# Patient Record
Sex: Male | Born: 1985 | Race: White | Hispanic: No | Marital: Single | Smoking: Current some day smoker
Health system: Southern US, Community
[De-identification: ages and names within clinical notes are randomized; demographics above are authoritative.]

## PROBLEM LIST (undated history)

## (undated) HISTORY — PX: INGUINAL HERNIA REPAIR: SUR1180

---

## 2012-12-16 ENCOUNTER — Emergency Department (HOSPITAL_COMMUNITY)
Admission: EM | Admit: 2012-12-16 | Discharge: 2012-12-16 | Disposition: A | Discharge status: Home / Self Care | Payer: Self-pay | Attending: Emergency Medicine | Admitting: Emergency Medicine

## 2012-12-16 ENCOUNTER — Encounter (HOSPITAL_COMMUNITY): Payer: Self-pay | Admitting: *Deleted

## 2012-12-16 DIAGNOSIS — F17200 Nicotine dependence, unspecified, uncomplicated: Secondary | ICD-10-CM | POA: Insufficient documentation

## 2012-12-16 DIAGNOSIS — R109 Unspecified abdominal pain: Secondary | ICD-10-CM | POA: Insufficient documentation

## 2012-12-16 DIAGNOSIS — K4090 Unilateral inguinal hernia, without obstruction or gangrene, not specified as recurrent: Secondary | ICD-10-CM | POA: Insufficient documentation

## 2012-12-16 DIAGNOSIS — R1900 Intra-abdominal and pelvic swelling, mass and lump, unspecified site: Secondary | ICD-10-CM | POA: Insufficient documentation

## 2012-12-16 NOTE — ED Provider Notes (Signed)
History     CSN: [id number]  Arrival date & time 12/16/12  1218   First MD Initiated Contact with Patient 12/16/12 1250      Chief Complaint  Patient presents with  . Abdominal Pain  . Hernia    (Consider location/radiation/quality/duration/timing/severity/associated sxs/prior treatment) HPI Complains of left groin pain and swelling at left groin intermittently for the past 2 weeks. Pain is worse with Valsalva type maneuver or lifting improved with remaining still . No other associated symptom no change in bowel habits bowel movements normal no vomiting no fever no other associated symptoms no treatment prior to coming here History reviewed. No pertinent past medical history. Past medical history negative History reviewed. No pertinent past surgical history.  History reviewed. No pertinent family history.  History  Substance Use Topics  . Smoking status: Current Every Day Smoker    Types: Cigarettes  . Smokeless tobacco: Not on file  . Alcohol Use: Yes     Comment: daily   positive marijuana use    Review of Systems  Constitutional: Negative.   HENT: Negative.   Respiratory: Negative.   Cardiovascular: Negative.   Gastrointestinal: Positive for abdominal pain.       Left groin pain  Musculoskeletal: Negative.   Skin: Negative.   Neurological: Negative.   Hematological: Negative.   Psychiatric/Behavioral: Negative.     Allergies  Silicone  Home Medications  No current outpatient prescriptions on file.  BP 143/92  Pulse 83  Temp 98.2 F (36.8 C) (Oral)  Resp 18  SpO2 100%  Physical Exam  Nursing note and vitals reviewed. Constitutional: He appears well-developed and well-nourished.  HENT:  Head: Normocephalic and atraumatic.  Eyes: Conjunctivae normal are normal. Pupils are equal, round, and reactive to light.  Neck: Neck supple. No tracheal deviation present. No thyromegaly present.  Cardiovascular: Normal rate and regular rhythm.   No murmur  heard. Pulmonary/Chest: Effort normal and breath sounds normal.  Abdominal: Soft. Bowel sounds are normal. He exhibits no distension. There is no tenderness.       There is a left inguinal hernia present when patient performs Valsalva maneuver  during standing. hernia reduces spontaneously. It is not red or warm and without signs of incarceration  Genitourinary:       Normal male genitalia, uncircumcised  Musculoskeletal: Normal range of motion. He exhibits no edema and no tenderness.  Neurological: He is alert. Coordination normal.  Skin: Skin is warm and dry. No rash noted.  Psychiatric: He has a normal mood and affect.    ED Course  Procedures (including critical care time)  Labs Reviewed - No data to display No results found.   No diagnosis found.    MDM  Plan referral central  surgery Diagnosis left inguinal hernia        Doug Sou, MD 12/16/12 1314

## 2012-12-16 NOTE — ED Notes (Signed)
Pt reports having left groin/abd pain. Thinks he has hernia. Denies any n/v/d.

## 2013-01-19 ENCOUNTER — Encounter (INDEPENDENT_AMBULATORY_CARE_PROVIDER_SITE_OTHER): Payer: Self-pay | Admitting: General Surgery

## 2013-01-19 ENCOUNTER — Ambulatory Visit (INDEPENDENT_AMBULATORY_CARE_PROVIDER_SITE_OTHER): Payer: PRIVATE HEALTH INSURANCE | Admitting: General Surgery

## 2013-01-19 VITALS — BP 126/64 | HR 68 | Temp 97.4°F | Resp 16 | Ht 70.0 in | Wt 157.2 lb

## 2013-01-19 DIAGNOSIS — K4090 Unilateral inguinal hernia, without obstruction or gangrene, not specified as recurrent: Secondary | ICD-10-CM | POA: Insufficient documentation

## 2013-01-19 NOTE — H&P (Signed)
Edward Gonzales is an 27 y.o. male.   Chief Complaint: the patient comes in for evaluation of a symptomatic left inguinal hernia. HPI: the patient is noted the hernia for some time but recently had a diagnosed on an emergency room visit because of continuing and worsening pain. He is otherwise healthy.  History reviewed. No pertinent past medical history.  History reviewed. No pertinent past surgical history.  Family History  Problem Relation Age of Onset  . Cancer Paternal Aunt     lymphoma   Social History:  reports that he has been smoking Cigarettes.  He has been smoking about 0.00 packs per day. He has never used smokeless tobacco. He reports that  drinks alcohol. He reports that he uses illicit drugs.  Allergies:  Allergies  Allergen Reactions  . Silicone Rash     (Not in a hospital admission)  No results found for this or any previous visit (from the past 48 hour(s)). No results found.  Review of Systems  Gastrointestinal: Negative.   All other systems reviewed and are negative.    Blood pressure 126/64, pulse 68, temperature 97.4 F (36.3 C), temperature source Temporal, resp. rate 16, height 5\' 10"  (1.778 m), weight 157 lb 3.2 oz (71.305 kg). Physical Exam  Constitutional: He is oriented to person, place, and time. He appears well-developed and well-nourished.  HENT:  Head: Normocephalic and atraumatic.  Eyes: Conjunctivae and EOM are normal. Pupils are equal, round, and reactive to light.  Neck: Normal range of motion. Neck supple.  Cardiovascular: Normal rate and regular rhythm.   Respiratory: Effort normal and breath sounds normal.  GI: Soft. Normal appearance and bowel sounds are normal. A hernia is present. Hernia confirmed positive in the left inguinal area.  Genitourinary: Penis normal.  Musculoskeletal: Normal range of motion.  Neurological: He is alert and oriented to person, place, and time. He has normal reflexes.  Skin: Skin is warm and dry.   Psychiatric: He has a normal mood and affect. His behavior is normal. Judgment and thought content normal.     Assessment/Plan Likely direct possibly small indirect left inguinal hernia.  Laparoscopic inguinal hernia repair with mesh. The risks and benefits have been explained to the patient and he wishes to proceed as soon as possible.  Cherylynn Ridges 01/19/2013, 4:07 PM

## 2013-01-19 NOTE — Progress Notes (Signed)
This patient's office visit has been documented as a admitting history and physical.

## 2016-02-21 ENCOUNTER — Emergency Department (HOSPITAL_COMMUNITY)
Admission: EM | Admit: 2016-02-21 | Discharge: 2016-02-21 | Disposition: A | Discharge status: Home / Self Care | Payer: Self-pay | Attending: Emergency Medicine | Admitting: Emergency Medicine

## 2016-02-21 ENCOUNTER — Encounter (HOSPITAL_COMMUNITY): Payer: Self-pay | Admitting: *Deleted

## 2016-02-21 DIAGNOSIS — F17210 Nicotine dependence, cigarettes, uncomplicated: Secondary | ICD-10-CM | POA: Insufficient documentation

## 2016-02-21 DIAGNOSIS — K4090 Unilateral inguinal hernia, without obstruction or gangrene, not specified as recurrent: Secondary | ICD-10-CM | POA: Insufficient documentation

## 2016-02-21 MED ORDER — HYDROMORPHONE HCL 1 MG/ML IJ SOLN
1.0000 mg | Freq: Once | INTRAMUSCULAR | Status: AC
  Administered 2016-02-21: 1 mg via INTRAVENOUS
  Filled 2016-02-21: qty 1

## 2016-02-21 MED ORDER — ONDANSETRON HCL 4 MG/2ML IJ SOLN
4.0000 mg | Freq: Once | INTRAMUSCULAR | Status: AC
  Administered 2016-02-21: 4 mg via INTRAVENOUS
  Filled 2016-02-21: qty 2

## 2016-02-21 NOTE — ED Provider Notes (Signed)
CSN: [id number]     Arrival date & time 02/21/16  1851 History   First MD Initiated Contact with Patient 02/21/16 2101     Chief Complaint  Patient presents with  . Hernia    Patient is a 30 y.o. male presenting with groin pain. The history is provided by the patient.  Groin Pain This is a recurrent problem. The current episode started 12 to 24 hours ago. The problem occurs constantly. The problem has been gradually worsening. Associated symptoms include abdominal pain. The symptoms are aggravated by walking. The symptoms are relieved by rest.  pt with h/o left inguinal hernia He has had this for several yrs but rarely causes pain Over past several hours he has had worsening pain/swelling in his left groin He reports nausea but no vomiting No new injury and no heavy lifting reported today   PMH -none Soc hx - smoker Family History  Problem Relation Age of Onset  . Cancer Paternal Aunt     lymphoma   Social History  Substance Use Topics  . Smoking status: Current Every Day Smoker    Types: Cigarettes  . Smokeless tobacco: Never Used  . Alcohol Use: Yes     Comment: daily    Review of Systems  Constitutional: Negative for fever.  Respiratory: Negative for cough.   Gastrointestinal: Positive for nausea and abdominal pain.  Genitourinary: Negative for dysuria.  Musculoskeletal: Negative for back pain.  All other systems reviewed and are negative.     Allergies  Silicone  Home Medications   Prior to Admission medications   Not on File   BP 160/97 mmHg  Pulse 97  Temp(Src) 97.5 F (36.4 C) (Oral)  Resp 18  SpO2 98% Physical Exam CONSTITUTIONAL: Well developed/well nourished HEAD: Normocephalic/atraumatic EYES: EOMI ENMT: Mucous membranes moist NECK: supple no meningeal signs CV: S1/S2 noted, no murmurs/rubs/gallops noted LUNGS: Lungs are clear to auscultation bilaterally, no apparent distress ABDOMEN: soft, nontender, no rebound or guarding, bowel sounds  noted throughout abdomen GU: left inguinal hernia noted without overlying erythema/discoloration.  No testicular tenderness.  No scrotal edema/erythema NEURO: Pt is awake/alert/appropriate, moves all extremitiesx4.  No facial droop.   EXTREMITIES: pulses normal/equal, full ROM SKIN: warm, color normal PSYCH: no abnormalities of mood noted, alert and oriented to situation  ED Course  Procedures  Medications  HYDROmorphone (DILAUDID) injection 1 mg (1 mg Intravenous Given 02/21/16 2229)  ondansetron (ZOFRAN) injection 4 mg (4 mg Intravenous Given 02/21/16 2226)  10:50 PM On initial exam, pt had significant tenderness and could not reduce hernia After IV dilaudid, pt more comfortable, no distress noted and his hernia had reduced spontaneously No other abdominal tenderness He appears well Will d/c home Discussed strict ER return precautions Gen. Surgery referral given   MDM   Final diagnoses:  Left inguinal hernia    Nursing notes including past medical history and social history reviewed and considered in documentation     Zadie Rhineonald Ressie Slevin, MD 02/21/16 2251

## 2016-02-21 NOTE — Discharge Instructions (Signed)

## 2016-02-21 NOTE — ED Notes (Signed)
Pt reports having hernia left groin, no problems in past but pain started to become severe today. Pain is radiating down into his testicles. No acute distress noted at triage.

## 2016-05-17 ENCOUNTER — Encounter (HOSPITAL_COMMUNITY): Payer: Self-pay

## 2016-05-17 ENCOUNTER — Emergency Department (HOSPITAL_COMMUNITY): Payer: Self-pay

## 2016-05-17 ENCOUNTER — Encounter (HOSPITAL_COMMUNITY): Payer: Self-pay | Admitting: Emergency Medicine

## 2016-05-17 ENCOUNTER — Emergency Department (HOSPITAL_COMMUNITY)
Admission: EM | Admit: 2016-05-17 | Discharge: 2016-05-17 | Disposition: A | Discharge status: Home / Self Care | Payer: Self-pay | Attending: Emergency Medicine | Admitting: Emergency Medicine

## 2016-05-17 ENCOUNTER — Ambulatory Visit (HOSPITAL_COMMUNITY)
Admission: EM | Admit: 2016-05-17 | Discharge: 2016-05-17 | Disposition: A | Discharge status: Nursing Home (Includes ICF) | Payer: Self-pay | Attending: Family Medicine | Admitting: Family Medicine

## 2016-05-17 DIAGNOSIS — R1032 Left lower quadrant pain: Secondary | ICD-10-CM | POA: Insufficient documentation

## 2016-05-17 DIAGNOSIS — F17210 Nicotine dependence, cigarettes, uncomplicated: Secondary | ICD-10-CM | POA: Insufficient documentation

## 2016-05-17 DIAGNOSIS — K4030 Unilateral inguinal hernia, with obstruction, without gangrene, not specified as recurrent: Secondary | ICD-10-CM

## 2016-05-17 LAB — URINALYSIS, ROUTINE W REFLEX MICROSCOPIC
Bilirubin Urine: NEGATIVE
Glucose, UA: NEGATIVE mg/dL
Hgb urine dipstick: NEGATIVE
Ketones, ur: >80 mg/dL — AB
LEUKOCYTES UA: NEGATIVE
NITRITE: NEGATIVE
PROTEIN: NEGATIVE mg/dL
SPECIFIC GRAVITY, URINE: 1.029 (ref 1.005–1.030)
pH: 7.5 (ref 5.0–8.0)

## 2016-05-17 LAB — CBC WITH DIFFERENTIAL/PLATELET
Basophils Absolute: 0 10*3/uL (ref 0.0–0.1)
Basophils Relative: 0 %
EOS PCT: 1 %
Eosinophils Absolute: 0.1 10*3/uL (ref 0.0–0.7)
HCT: 40.9 % (ref 39.0–52.0)
Hemoglobin: 14.2 g/dL (ref 13.0–17.0)
LYMPHS ABS: 2.5 10*3/uL (ref 0.7–4.0)
LYMPHS PCT: 36 %
MCH: 31.1 pg (ref 26.0–34.0)
MCHC: 34.7 g/dL (ref 30.0–36.0)
MCV: 89.5 fL (ref 78.0–100.0)
MONO ABS: 0.8 10*3/uL (ref 0.1–1.0)
Monocytes Relative: 12 %
Neutro Abs: 3.5 10*3/uL (ref 1.7–7.7)
Neutrophils Relative %: 51 %
PLATELETS: 166 10*3/uL (ref 150–400)
RBC: 4.57 MIL/uL (ref 4.22–5.81)
RDW: 12.1 % (ref 11.5–15.5)
WBC: 6.9 10*3/uL (ref 4.0–10.5)

## 2016-05-17 LAB — BASIC METABOLIC PANEL
ANION GAP: 8 (ref 5–15)
BUN: 7 mg/dL (ref 6–20)
CHLORIDE: 107 mmol/L (ref 101–111)
CO2: 23 mmol/L (ref 22–32)
Calcium: 8.7 mg/dL — ABNORMAL LOW (ref 8.9–10.3)
Creatinine, Ser: 0.85 mg/dL (ref 0.61–1.24)
GFR calc Af Amer: >60 mL/min (ref >60)
GFR calc non Af Amer: >60 mL/min (ref >60)
Glucose, Bld: 89 mg/dL (ref 65–99)
POTASSIUM: 3.7 mmol/L (ref 3.5–5.1)
SODIUM: 138 mmol/L (ref 135–145)

## 2016-05-17 MED ORDER — IOPAMIDOL (ISOVUE-300) INJECTION 61%
INTRAVENOUS | Status: AC
  Administered 2016-05-17: 100 mL
  Filled 2016-05-17: qty 100

## 2016-05-17 MED ORDER — MORPHINE SULFATE (PF) 4 MG/ML IV SOLN
4.0000 mg | Freq: Once | INTRAVENOUS | Status: AC
  Administered 2016-05-17: 4 mg via INTRAVENOUS
  Filled 2016-05-17: qty 1

## 2016-05-17 MED ORDER — HYDROCODONE-ACETAMINOPHEN 5-325 MG PO TABS: 1.0000 | ORAL_TABLET | Freq: Four times a day (QID) | ORAL | Status: DC | PRN

## 2016-05-17 MED ORDER — ONDANSETRON HCL 4 MG/2ML IJ SOLN
4.0000 mg | Freq: Once | INTRAMUSCULAR | Status: AC
  Administered 2016-05-17: 4 mg via INTRAVENOUS
  Filled 2016-05-17: qty 2

## 2016-05-17 NOTE — ED Notes (Signed)
Complain of groin pain, hernia for 3 years that has worsened recently.

## 2016-05-17 NOTE — ED Notes (Signed)
Pt. Was sent to us from Urgent care for an incarcerated Lt. inguinal hernia.  Pt. Is  Lt. Testicle pain 8/10

## 2016-05-17 NOTE — ED Provider Notes (Signed)
CSN: [id number]     Arrival date & time 05/17/16  1306 History   First MD Initiated Contact with Patient 05/17/16 1348     Chief Complaint  Patient presents with  . Hernia   (Consider location/radiation/quality/duration/timing/severity/associated sxs/prior Treatment) Patient is a 30 y.o. male presenting with abdominal pain. The history is provided by the patient.  Abdominal Pain Pain location:  LLQ Pain quality: sharp   Pain radiates to:  Does not radiate Pain severity:  Moderate Onset quality:  Gradual Progression:  Worsening Chronicity:  Chronic Context comment:  Present for 2-548yrs, worse in past 1wk. Relieved by:  Nothing Worsened by:  Nothing tried Ineffective treatments:  None tried Associated symptoms: nausea   Associated symptoms: no fever and no vomiting     History reviewed. No pertinent past medical history. History reviewed. No pertinent past surgical history. Family History  Problem Relation Age of Onset  . Cancer Paternal Aunt     lymphoma   Social History  Substance Use Topics  . Smoking status: Current Every Day Smoker    Types: Cigarettes  . Smokeless tobacco: Never Used  . Alcohol Use: Yes     Comment: daily    Review of Systems  Constitutional: Negative.  Negative for fever.  Gastrointestinal: Positive for nausea and abdominal pain. Negative for vomiting.  Genitourinary: Positive for testicular pain. Negative for flank pain and discharge.  Musculoskeletal: Negative for back pain.  All other systems reviewed and are negative.   Allergies  Silicone  Home Medications   Prior to Admission medications   Not on File   Meds Ordered and Administered this Visit  Medications - No data to display  BP 131/84 mmHg  Pulse 67  Temp(Src) 98.7 F (37.1 C) (Oral)  Resp 16  SpO2 99% No data found.   Physical Exam  Constitutional: He is oriented to person, place, and time. He appears well-developed and well-nourished.  Abdominal: Soft. Bowel sounds  are normal. He exhibits no mass. There is tenderness. There is no rebound and no guarding. A hernia is present. Hernia confirmed positive in the left inguinal area. Hernia confirmed negative in the right inguinal area.    Neurological: He is alert and oriented to person, place, and time.  Skin: Skin is warm and dry.  Nursing note and vitals reviewed.   ED Course  Procedures (including critical care time)  Labs Review Labs Reviewed - No data to display  Imaging Review No results found.   Visual Acuity Review  Right Eye Distance:   Left Eye Distance:   Bilateral Distance:    Right Eye Near:   Left Eye Near:    Bilateral Near:         MDM   1. Incarcerated left inguinal hernia    Sent for surg eval of incar left inguinal hernia.    Linna HoffJames D Nikkita Adeyemi, MD 05/17/16 [phone]

## 2016-05-17 NOTE — ED Provider Notes (Signed)
CSN: [id number]     Arrival date & time 05/17/16  1502 History   First MD Initiated Contact with Patient 05/17/16 1652     Chief Complaint  Patient presents with  . Inguinal Hernia     (Consider location/radiation/quality/duration/timing/severity/associated sxs/prior Treatment) HPI Comments: Patient presents to the emergency department with chief complaint of left lower quadrant pain. He states that he has a known hernia which she has had for the past 2-3 years. States that it has recently significantly worsened over the past week or so. He was seen at urgent care today, and sent to the emergency department for further evaluation. He denies any vomiting, but states that he has had some associated nausea. He states that the pain comes and goes, and radiates to his testicle. Currently rates pain as an 8 out of 10 and is worsened with palpation. He has not taken anything for her symptoms. 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  The history is provided by the patient. No language interpreter was used.    History reviewed. No pertinent past medical history. History reviewed. No pertinent past surgical history. Family History  Problem Relation Age of Onset  . Cancer Paternal Aunt     lymphoma   Social History  Substance Use Topics  . Smoking status: Current Every Day Smoker    Types: Cigarettes  . Smokeless tobacco: Never Used  . Alcohol Use: Yes     Comment: daily    Review of Systems  Constitutional: Negative for fever and chills.  Respiratory: Negative for shortness of breath.   Cardiovascular: Negative for chest pain.  Gastrointestinal: Positive for nausea and abdominal pain. Negative for vomiting, diarrhea and constipation.  Genitourinary: Negative for dysuria.  All other systems reviewed and are negative.     Allergies  Silicone  Home Medications   Prior to Admission medications   Not on File   BP 118/75 mmHg  Pulse 61   Temp(Src) 97.5 F (36.4 C) (Oral)  Resp 14  Ht  (1.753 m)  Wt 65.857 kg  BMI 21.43 kg/m2  SpO2 100% Physical Exam  Constitutional: He is oriented to person, place, and time. He appears well-developed and well-nourished.  HENT:  Head: Normocephalic and atraumatic.  Eyes: Conjunctivae and EOM are normal. Pupils are equal, round, and reactive to light. Right eye exhibits no discharge. Left eye exhibits no discharge. No scleral icterus.  Neck: Normal range of motion. Neck supple. No JVD present.  Cardiovascular: Normal rate, regular rhythm and normal heart sounds.  Exam reveals no gallop and no friction rub.   No murmur heard. Pulmonary/Chest: Effort normal and breath sounds normal. No respiratory distress. He has no wheezes. He has no rales. He exhibits no tenderness.  Abdominal: Soft. He exhibits no distension and no mass. There is no tenderness. There is no rebound and no guarding.  Genitourinary:  No tenderness to palpation of testicles, normal cremasteric reflex bilaterally, no swelling, moderate tenderness to palpation to the left inguinal canal no erythema, no abscess  Musculoskeletal: Normal range of motion. He exhibits no edema or tenderness.  Neurological: He is alert and oriented to person, place, and time.  Skin: Skin is warm and dry.  Psychiatric: He has a normal mood and affect. His behavior is normal. Judgment and thought content normal.  Nursing note and vitals reviewed.   ED Course  Procedures (including critical care time) Results for orders placed or performed during the  hospital encounter of 05/17/16  CBC with Differential/Platelet  Result Value Ref Range   WBC 6.9 4.0 - 10.5 K/uL   RBC 4.57 4.22 - 5.81 MIL/uL   Hemoglobin 14.2 13.0 - 17.0 g/dL   HCT 04.540.9 40.939.0 - 81.152.0 %   MCV 89.5 78.0 - 100.0 fL   MCH 31.1 26.0 - 34.0 pg   MCHC 34.7 30.0 - 36.0 g/dL   RDW 91.412.1 78.211.5 - 95.615.5 %   Platelets 166 150 - 400 K/uL   Neutrophils Relative % 51 %   Neutro Abs 3.5 1.7  - 7.7 K/uL   Lymphocytes Relative 36 %   Lymphs Abs 2.5 0.7 - 4.0 K/uL   Monocytes Relative 12 %   Monocytes Absolute 0.8 0.1 - 1.0 K/uL   Eosinophils Relative 1 %   Eosinophils Absolute 0.1 0.0 - 0.7 K/uL   Basophils Relative 0 %   Basophils Absolute 0.0 0.0 - 0.1 K/uL  Basic metabolic panel  Result Value Ref Range   Sodium 138 135 - 145 mmol/L   Potassium 3.7 3.5 - 5.1 mmol/L   Chloride 107 101 - 111 mmol/L   CO2 23 22 - 32 mmol/L   Glucose, Bld 89 65 - 99 mg/dL   BUN 7 6 - 20 mg/dL   Creatinine, Ser 2.130.85 0.61 - 1.24 mg/dL   Calcium 8.7 (L) 8.9 - 10.3 mg/dL   GFR calc non Af Amer >60 >60 mL/min   GFR calc Af Amer >60 >60 mL/min   Anion gap 8 5 - 15  Urinalysis, Routine w reflex microscopic (not [facility])  Result Value Ref Range   Color, Urine YELLOW YELLOW   APPearance CLEAR CLEAR   Specific Gravity, Urine 1.029 1.005 - 1.030   pH 7.5 5.0 - 8.0   Glucose, UA NEGATIVE NEGATIVE mg/dL   Hgb urine dipstick NEGATIVE NEGATIVE   Bilirubin Urine NEGATIVE NEGATIVE   Ketones, ur >80 (A) NEGATIVE mg/dL   Protein, ur NEGATIVE NEGATIVE mg/dL   Nitrite NEGATIVE NEGATIVE   Leukocytes, UA NEGATIVE NEGATIVE   Ct Abdomen Pelvis W Contrast  05/17/2016  CLINICAL DATA:  Left-sided abdominal pain radiating into the groin for 2 days EXAM: CT ABDOMEN AND PELVIS WITH CONTRAST TECHNIQUE: Multidetector CT imaging of the abdomen and pelvis was performed using the standard protocol following bolus administration of intravenous contrast. CONTRAST:  100mL ISOVUE-300 IOPAMIDOL (ISOVUE-300) INJECTION 61% COMPARISON:  None. FINDINGS: Lung bases are free of acute infiltrate or sizable effusion. The liver, gallbladder, spleen, adrenal glands and pancreas are within normal limits. The kidneys are well visualized bilaterally without renal calculi or obstructive change. The appendix is well visualized within normal limits. No inflammatory changes are seen. The bladder is well distended. No pelvic mass lesion or  sidewall abnormality is noted. No inguinal hernias are seen. No acute bony abnormality is noted. IMPRESSION: No acute abnormality noted. Electronically Signed   By: Alcide CleverMark  Lukens M.D.   On: 05/17/2016 18:27    I have personally reviewed and evaluated these images and lab results as part of my medical decision-making.   MDM   Final diagnoses:  Left inguinal pain    Patient sent to ED by urgent care for evaluation of possible left inguinal hernia. Patient states pain radiates from his left inguinal canal to his left testicle. He denies pain with palpation of the left testicle, and has a normal GU exam, no discharge, no lesions, normal cremasteric reflex. Symptoms have been intermittent for 2-3 years, and worse in  the past week. Doubt torsion. Will check CT scan of abdomen.  CT scan is unremarkable for any inguinal hernia. Unclear etiology of patient's pain. Urinalysis is unremarkable for infection. Will recommend follow-up with primary care provider. Return precautions given. Patient is stable and ready for discharge.    Roxy Horseman, PA-C 05/17/16 1959  Mancel Bale, MD 05/18/16 [phone]

## 2016-05-17 NOTE — ED Notes (Addendum)
Pt verbalized understanding of d/c instructions and has no further questions. Pt stable and NAD. Pt d/c home with friend driving.  

## 2016-05-17 NOTE — Discharge Instructions (Signed)
Abdominal Pain, Adult °Many things can cause abdominal pain. Usually, abdominal pain is not caused by a disease and will improve without treatment. It can often be observed and treated at home. Your health care provider will do a physical exam and possibly order blood tests and X-rays to help determine the seriousness of your pain. However, in many cases, more time must pass before a clear cause of the pain can be found. Before that point, your health care provider may not know if you need more testing or further treatment. °HOME CARE INSTRUCTIONS °Monitor your abdominal pain for any changes. The following actions may help to alleviate any discomfort you are experiencing: °· Only take over-the-counter or prescription medicines as directed by your health care provider. °· Do not take laxatives unless directed to do so by your health care provider. °· Try a clear liquid diet (broth, tea, or water) as directed by your health care provider. Slowly move to a bland diet as tolerated. °SEEK MEDICAL CARE IF: °· You have unexplained abdominal pain. °· You have abdominal pain associated with nausea or diarrhea. °· You have pain when you urinate or have a bowel movement. °· You experience abdominal pain that wakes you in the night. °· You have abdominal pain that is worsened or improved by eating food. °· You have abdominal pain that is worsened with eating fatty foods. °· You have a fever. °SEEK IMMEDIATE MEDICAL CARE IF: °· Your pain does not go away within 2 hours. °· You keep throwing up (vomiting). °· Your pain is felt only in portions of the abdomen, such as the right side or the left lower portion of the abdomen. °· You pass bloody or black tarry stools. °MAKE SURE YOU: °· Understand these instructions. °· Will watch your condition. °· Will get help right away if you are not doing well or get worse. °  °This information is not intended to replace advice given to you by your health care provider. Make sure you discuss  any questions you have with your health care provider. °  °Document Released: 09/04/2005 Document Revised: 08/16/2015 Document Reviewed: 08/04/2013 °Elsevier Interactive Patient Education ©2016 [employer] ° °Flank Pain °Flank pain refers to pain that is located on the side of the body between the upper abdomen and the back. The pain may occur over a short period of time (acute) or may be long-term or reoccurring (chronic). It may be mild or severe. Flank pain can be caused by many things. °CAUSES  °Some of the more common causes of flank pain include: °· Muscle strains.   °· Muscle spasms.   °· A disease of your spine (vertebral disk disease).   °· A lung infection (pneumonia).   °· Fluid around your lungs (pulmonary edema).   °· A kidney infection.   °· Kidney stones.   °· A very painful skin rash caused by the chickenpox virus (shingles).   °· Gallbladder disease.   °HOME CARE INSTRUCTIONS  °Home care will depend on the cause of your pain. In general, °· Rest as directed by your caregiver. °· Drink enough fluids to keep your urine clear or pale yellow. °· Only take over-the-counter or prescription medicines as directed by your caregiver. Some medicines may help relieve the pain. °· Tell your caregiver about any changes in your pain. °· Follow up with your caregiver as directed. °SEEK IMMEDIATE MEDICAL CARE IF:  °· Your pain is not controlled with medicine.   °· You have new or worsening symptoms. °· Your pain increases.   °· You have abdominal   pain.   °· You have shortness of breath.   °· You have persistent nausea or vomiting.   °· You have swelling in your abdomen.   °· You feel faint or pass out.   °· You have blood in your urine. °· You have a fever or persistent symptoms for more than 2-3 days. °· You have a fever and your symptoms suddenly get worse. °MAKE SURE YOU:  °· Understand these instructions. °· Will watch your condition. °· Will get help right away if you are not doing well or get worse. °  °This  information is not intended to replace advice given to you by your health care provider. Make sure you discuss any questions you have with your health care provider. °  °Document Released: 01/16/2006 Document Revised: 08/19/2012 Document Reviewed: 07/09/2012 °Elsevier Interactive Patient Education ©2016 [employer] ° °

## 2016-06-21 ENCOUNTER — Ambulatory Visit (INDEPENDENT_AMBULATORY_CARE_PROVIDER_SITE_OTHER): Payer: Self-pay | Admitting: Family Medicine

## 2016-06-21 ENCOUNTER — Encounter: Payer: Self-pay | Admitting: Family Medicine

## 2016-06-21 VITALS — BP 116/71 | HR 68 | Temp 97.9°F | Resp 14 | Ht 69.0 in | Wt 149.0 lb

## 2016-06-21 DIAGNOSIS — K4090 Unilateral inguinal hernia, without obstruction or gangrene, not specified as recurrent: Secondary | ICD-10-CM

## 2016-06-21 DIAGNOSIS — Z23 Encounter for immunization: Secondary | ICD-10-CM

## 2016-06-24 NOTE — Patient Instructions (Signed)
Go for US to see if we can confirm diagnosis of hernia.

## 2016-06-24 NOTE — Progress Notes (Signed)
Patient ID: Edward Gonzales, male   DOB: 09/20/1986, 30 y.o.   MRN: [id number]   Edward Gonzales, is a 30 y.o. male  WUJ:[id number]  NFA:[id number]:[id number]  DOB - 06/10/1986  CC:  Chief Complaint  Patient presents with  . Hernia    needs referral for surgery        HPI: Edward Gonzales is a 30 y.o. male here to establish care. His only complaint is of a left inguinal. He was seen at urgent care and ED recently and diagnosed with hernia based on physical finding. A CT scan failed to find a hernia.. Patient denies any additional health problems. Is on no chronic medications. He is in need of tetanus booster. He does not remember being screened for HIV. We will address that the next time he needs labs.  Allergies  Allergen Reactions  . Silicone Rash   History reviewed. No pertinent past medical history. Current Outpatient Prescriptions on File Prior to Visit  Medication Sig Dispense Refill  . HYDROcodone-acetaminophen (NORCO/VICODIN) 5-325 MG tablet Take 1 tablet by mouth every 6 (six) hours as needed. (Patient not taking: Reported on 06/21/2016) 6 tablet 0   No current facility-administered medications on file prior to visit.   Family History  Problem Relation Age of Onset  . Cancer Paternal Aunt     lymphoma   Social History   Social History  . Marital Status: Single    Spouse Name: N/A  . Number of Children: N/A  . Years of Education: N/A   Occupational History  . Not on file.   Social History Main Topics  . Smoking status: Current Some Day Smoker    Types: Cigarettes  . Smokeless tobacco: Never Used  . Alcohol Use: Yes     Comment: daily occ.   . Drug Use: Yes    Special: Marijuana     Comment: daily  . Sexual Activity: Not on file   Other Topics Concern  . Not on file   Social History Narrative    Review of Systems: Constitutional: Negative for fever, chills, appetite change, weight loss,  Fatigue. Skin: Negative for rashes or lesions of concern. HENT: Negative  for ear pain, ear discharge.nose bleeds Eyes: Negative for pain, discharge, redness, itching and visual disturbance. Neck: Negative for pain, stiffness Respiratory: Negative for cough, shortness of breath,   Cardiovascular: Negative for chest pain, palpitations and leg swelling. Gastrointestinal: Negative for abdominal pain, nausea, vomiting, diarrhea, constipations. Positive for lump in left groin Genitourinary: Negative for dysuria, urgency, frequency, hematuria,  Musculoskeletal: Negative for back pain, joint pain, joint  swelling, and gait problem.Negative for weakness. Neurological: Negative for dizziness, tremors, seizures, syncope,   light-headedness, numbness and headaches.  Hematological: Negative for easy bruising or bleeding Psychiatric/Behavioral: Negative for depression, anxiety, decreased concentration, confusion   Objective:   Filed Vitals:   06/21/16 1109  BP: 116/71  Pulse: 68  Temp: 97.9 F (36.6 C)  Resp: 14    Physical Exam: Constitutional: Patient appears well-developed and well-nourished. No distress. HENT: Normocephalic, atraumatic, External right and left ear normal. Oropharynx is clear and moist.  Eyes: Conjunctivae and EOM are normal. PERRLA, no scleral icterus. Neck: Normal ROM. Neck supple. No lymphadenopathy, No thyromegaly. CVS: RRR, S1/S2 +, no murmurs, no gallops, no rubs Pulmonary: Effort and breath sounds normal, no stridor, rhonchi, wheezes, rales.  Abdominal: Soft. Normoactive BS,, no distension, tenderness, rebound or guarding. There is a firm lump in the left groin approx 3x3. The lump is firm. I am  unable to reduce due to his discomfort. Musculoskeletal: Normal range of motion. No edema and no tenderness.  Neuro: Alert.Normal muscle tone coordination. Non-focal Skin: Skin is warm and dry. No rash noted. Not diaphoretic. No erythema. No pallor. Psychiatric: Normal mood and affect. Behavior, judgment, thought content normal.  Lab Results   Component Value Date   WBC 6.9 05/17/2016   HGB 14.2 05/17/2016   HCT 40.9 05/17/2016   MCV 89.5 05/17/2016   PLT 166 05/17/2016   Lab Results  Component Value Date   CREATININE 0.85 05/17/2016   BUN 7 05/17/2016   NA 138 05/17/2016   K 3.7 05/17/2016   CL 107 05/17/2016   CO2 23 05/17/2016    No results found for: HGBA1C Lipid Panel  No results found for: CHOL, TRIG, HDL, CHOLHDL, VLDL, LDLCALC     Assessment and plan:   1. Unilateral inguinal hernia without obstruction or gangrene, recurrence not specified  - US Abdomen Limited; Future   No Follow-up on file.  The patient was given clear instructions to go to ER or return to medical center if symptoms don't improve, worsen or new problems develop. The patient verbalized understanding.    Henrietta Hoover FNP  06/24/2016, 12:50 PM

## 2017-05-25 IMAGING — CT CT ABD-PELV W/ CM
2 of 4 series · 16 of 46 positions shown, 18 images · IV contrast (APPLIED)
Comparison: None.

CLINICAL DATA: Left-sided abdominal pain radiating into the groin
for 2 days

EXAM:
CT ABDOMEN AND PELVIS WITH CONTRAST
TECHNIQUE: Multidetector CT imaging of the abdomen and pelvis was performed
using the standard protocol following bolus administration of
intravenous contrast.
CONTRAST:  100mL TBUYIB-8RR IOPAMIDOL (TBUYIB-8RR) INJECTION 61%

[Series 2: abd/ pelvis 5.0 i30f 1 · axial · 0.62mm/px · z∈[-834,-419]mm · 13 of 91 slices shown, 15 images]
[im 4/91  soft-tissue]
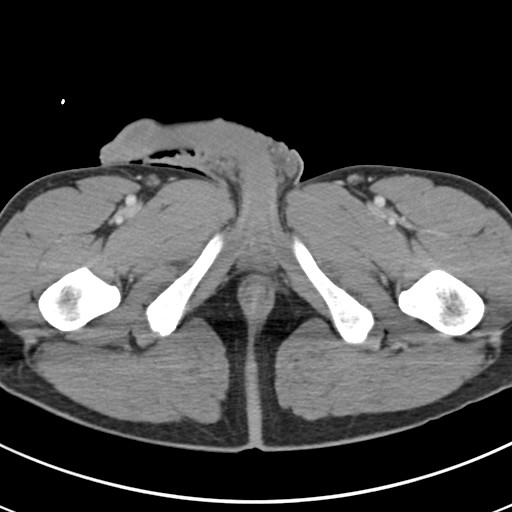
[im 4/91  bone]
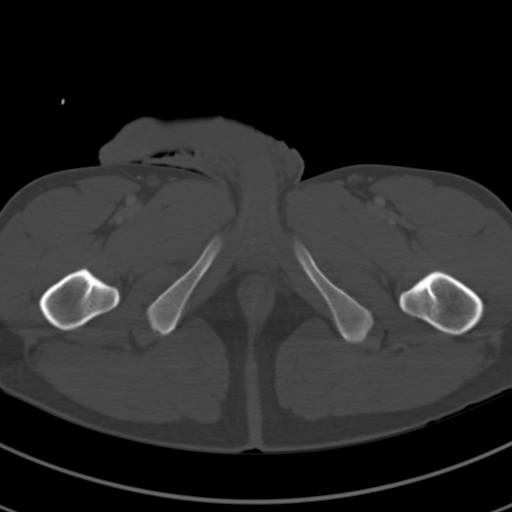
[im 12/91  soft-tissue]
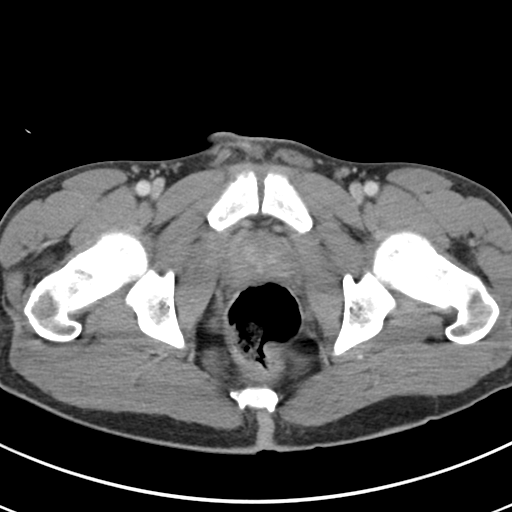
[im 19/91  soft-tissue]
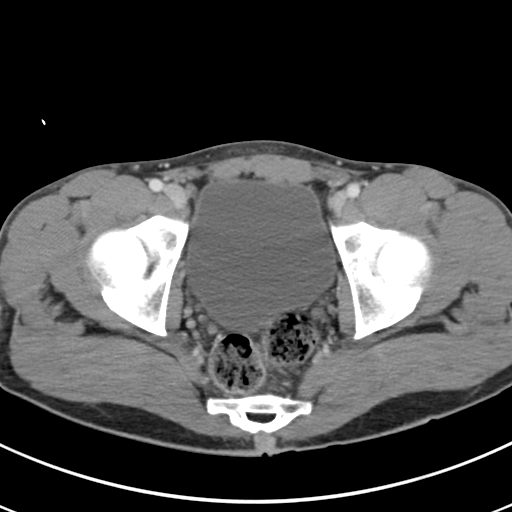
[im 27/91  soft-tissue]
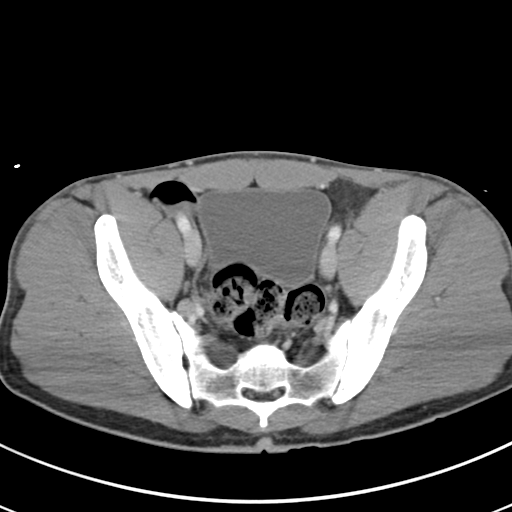
[im 31/91  soft-tissue]
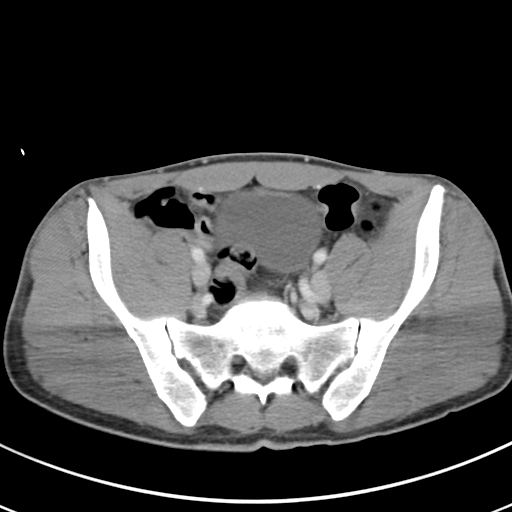
[im 38/91  soft-tissue]
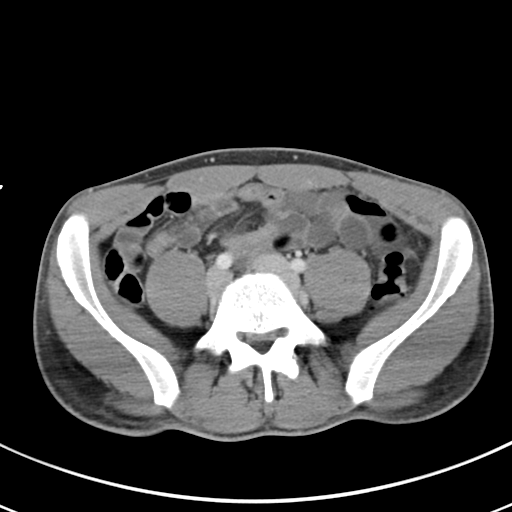
[im 46/91  soft-tissue]
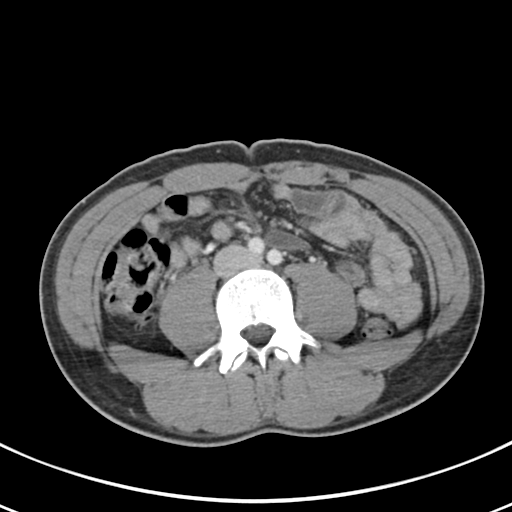
[im 53/91  soft-tissue]
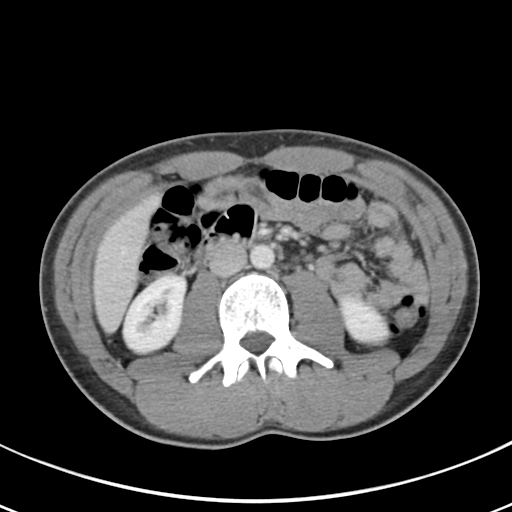
[im 61/91  soft-tissue]
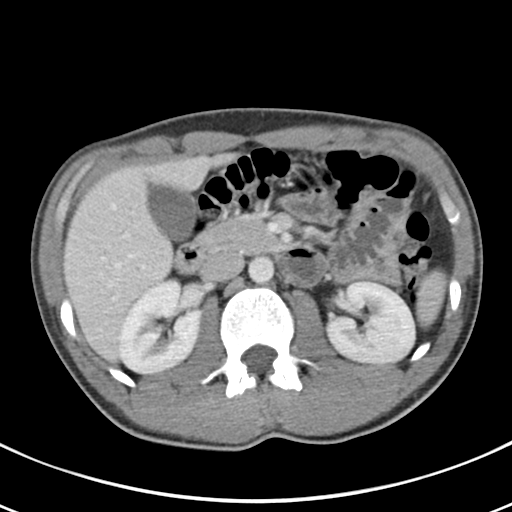
[im 61/91  bone]
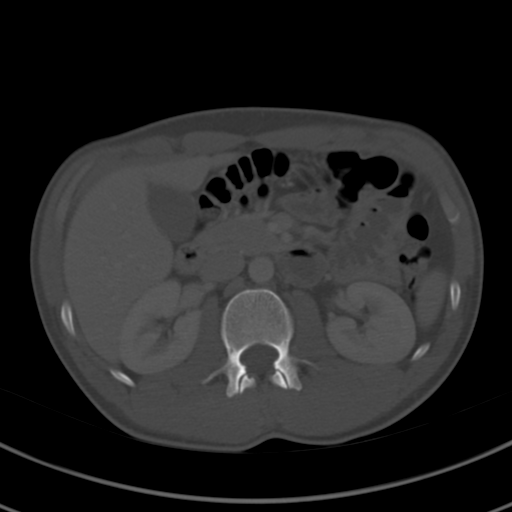
[im 64/91  soft-tissue]
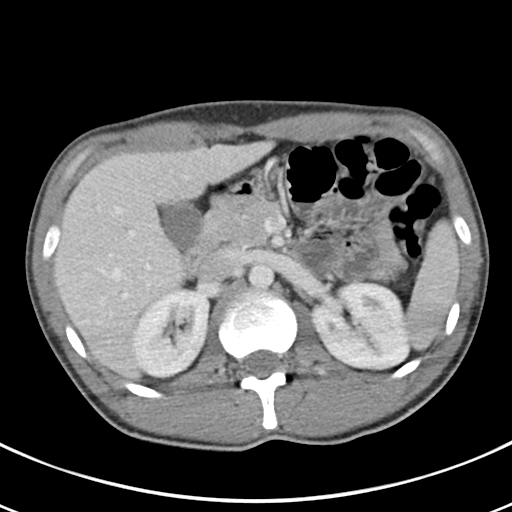
[im 72/91  soft-tissue]
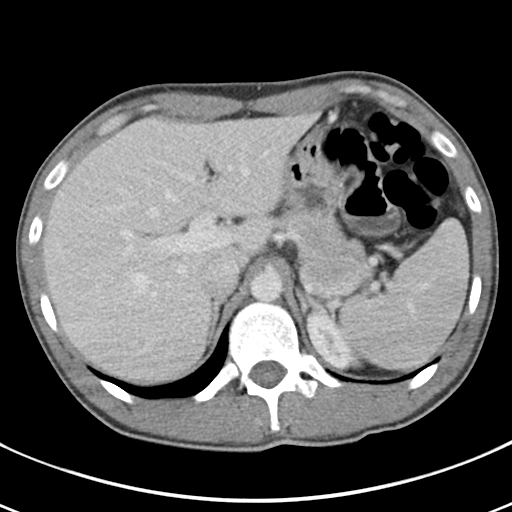
[im 79/91  soft-tissue]
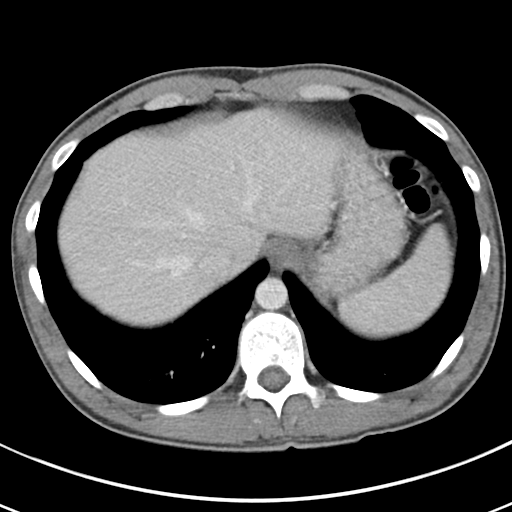
[im 87/91  soft-tissue]
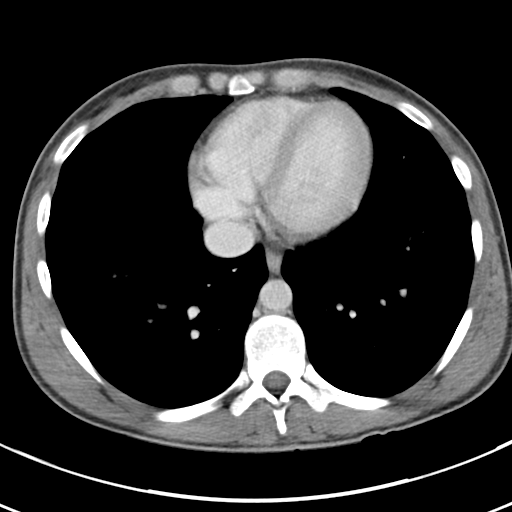

[Series 6: coronal soft tissue · coronal · 0.68mm/px · 3 of 87 slices shown]
[im 29/87  soft-tissue]
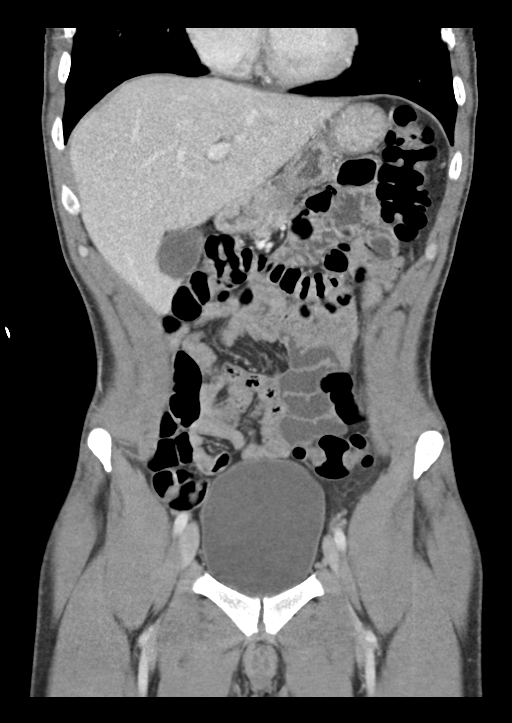
[im 39/87  soft-tissue]
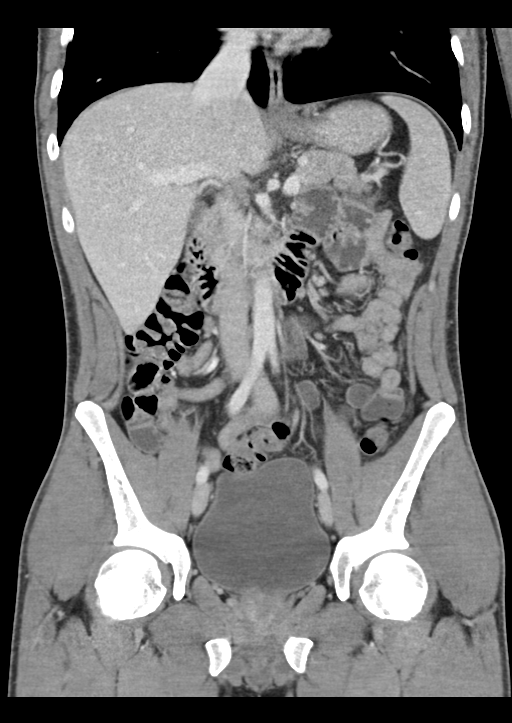
[im 48/87  soft-tissue]
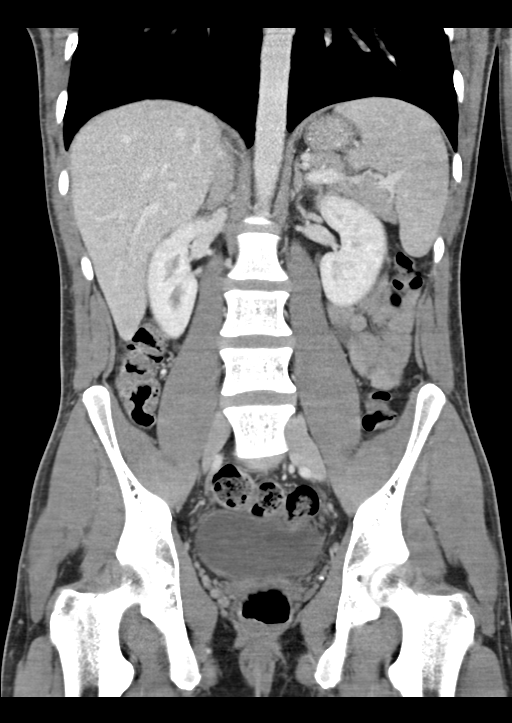

[16 of 46 positions shown; findings below may reference images not displayed]

FINDINGS: Lung bases are free of acute infiltrate or sizable effusion.

The liver, gallbladder, spleen, adrenal glands and pancreas are
within normal limits. The kidneys are well visualized bilaterally
without renal calculi or obstructive change.

The appendix is well visualized within normal limits. No
inflammatory changes are seen. The bladder is well distended. No
pelvic mass lesion or sidewall abnormality is noted. No inguinal
hernias are seen. No acute bony abnormality is noted.
IMPRESSION: No acute abnormality noted.

## 2022-03-19 ENCOUNTER — Encounter (HOSPITAL_COMMUNITY): Payer: Self-pay

## 2022-03-19 ENCOUNTER — Other Ambulatory Visit: Payer: Self-pay

## 2022-03-19 ENCOUNTER — Observation Stay (HOSPITAL_COMMUNITY)
Admission: EM | Admit: 2022-03-19 | Discharge: 2022-03-20 | Disposition: A | Discharge status: Home / Self Care | Payer: No Typology Code available for payment source | Attending: Otolaryngology | Admitting: Otolaryngology

## 2022-03-19 ENCOUNTER — Emergency Department (HOSPITAL_COMMUNITY): Payer: No Typology Code available for payment source

## 2022-03-19 DIAGNOSIS — S02652A Fracture of angle of left mandible, initial encounter for closed fracture: Secondary | ICD-10-CM

## 2022-03-19 DIAGNOSIS — S02609A Fracture of mandible, unspecified, initial encounter for closed fracture: Principal | ICD-10-CM | POA: Diagnosis present

## 2022-03-19 DIAGNOSIS — W11XXXA Fall on and from ladder, initial encounter: Secondary | ICD-10-CM

## 2022-03-19 LAB — BASIC METABOLIC PANEL
Anion gap: 9 (ref 5–15)
BUN: 9 mg/dL (ref 6–20)
CO2: 26 mmol/L (ref 22–32)
Calcium: 8.8 mg/dL — ABNORMAL LOW (ref 8.9–10.3)
Chloride: 103 mmol/L (ref 98–111)
Creatinine, Ser: 0.78 mg/dL (ref 0.61–1.24)
GFR, Estimated: >60 mL/min (ref >60)
Glucose, Bld: 93 mg/dL (ref 70–99)
Potassium: 3.8 mmol/L (ref 3.5–5.1)
Sodium: 138 mmol/L (ref 135–145)

## 2022-03-19 LAB — CBC WITH DIFFERENTIAL/PLATELET
Abs Immature Granulocytes: 0.01 10*3/uL (ref 0.00–0.07)
Basophils Absolute: 0.1 10*3/uL (ref 0.0–0.1)
Basophils Relative: 1 %
Eosinophils Absolute: 0.1 10*3/uL (ref 0.0–0.5)
Eosinophils Relative: 2 %
HCT: 41.5 % (ref 39.0–52.0)
Hemoglobin: 14.3 g/dL (ref 13.0–17.0)
Immature Granulocytes: 0 %
Lymphocytes Relative: 38 %
Lymphs Abs: 2.5 10*3/uL (ref 0.7–4.0)
MCH: 32.9 pg (ref 26.0–34.0)
MCHC: 34.5 g/dL (ref 30.0–36.0)
MCV: 95.4 fL (ref 80.0–100.0)
Monocytes Absolute: 0.8 10*3/uL (ref 0.1–1.0)
Monocytes Relative: 12 %
Neutro Abs: 3.2 10*3/uL (ref 1.7–7.7)
Neutrophils Relative %: 47 %
Platelets: 193 10*3/uL (ref 150–400)
RBC: 4.35 MIL/uL (ref 4.22–5.81)
RDW: 11.4 % — ABNORMAL LOW (ref 11.5–15.5)
WBC: 6.7 10*3/uL (ref 4.0–10.5)
nRBC: 0 % (ref 0.0–0.2)

## 2022-03-19 MED ORDER — MORPHINE SULFATE (PF) 4 MG/ML IV SOLN
4.0000 mg | Freq: Once | INTRAVENOUS | Status: AC
  Administered 2022-03-19: 4 mg via INTRAVENOUS
  Filled 2022-03-19: qty 1

## 2022-03-19 MED ORDER — DEXTROSE-NACL 5-0.45 % IV SOLN: INTRAVENOUS | Status: DC

## 2022-03-19 MED ORDER — HYDROCODONE-ACETAMINOPHEN 5-325 MG PO TABS
1.0000 | ORAL_TABLET | ORAL | Status: DC | PRN
  Administered 2022-03-20: 1 via ORAL
  Filled 2022-03-19: qty 1

## 2022-03-19 MED ORDER — SODIUM CHLORIDE 0.9 % IV SOLN
3.0000 g | Freq: Four times a day (QID) | INTRAVENOUS | Status: DC
  Administered 2022-03-19 – 2022-03-20 (×3): 3 g via INTRAVENOUS
  Filled 2022-03-19 (×4): qty 8

## 2022-03-19 MED ORDER — ONDANSETRON HCL 4 MG/2ML IJ SOLN: 4.0000 mg | Freq: Four times a day (QID) | INTRAMUSCULAR | Status: DC | PRN

## 2022-03-19 MED ORDER — ONDANSETRON 4 MG PO TBDP: 4.0000 mg | ORAL_TABLET | Freq: Four times a day (QID) | ORAL | Status: DC | PRN

## 2022-03-19 NOTE — Consult Note (Signed)
Reason for Consult:mandible fracture ?Referring Physician: Dr Renaye Rakers ? ?Edward Gonzales is an 36 y.o. male.  ?HPI: With a history of a fall and hit his chin on a ladder rung.  This happened on Friday.  He initially did not think he had any injury just had some pain and thought he heard a tooth.  He is pain has increased.  He does have malocclusion which manifest as a shift in his occlusion to the left.  He has now presented and had a CT scan which shows a fracture of both angles left extends up more into the condyle and is slightly displaced. ? ?History reviewed. No pertinent past medical history. ? ?History reviewed. No pertinent surgical history. ? ?Family History  ?Problem Relation Age of Onset  ? Cancer Paternal Aunt   ?     lymphoma  ? ? ?Social History:  reports that he has been smoking cigarettes. He has never used smokeless tobacco. He reports current alcohol use. He reports current drug use. Drug: Marijuana. ? ?Allergies:  ?Allergies  ?Allergen Reactions  ? Silicone Rash  ? ? ?Medications: I have reviewed the patient's current medications. ? ?Results for orders placed or performed during the hospital encounter of 03/19/22 (from the past 48 hour(s))  ?CBC with Differential     Status: Abnormal  ? Collection Time: 03/19/22  7:24 PM  ?Result Value Ref Range  ? WBC 6.7 4.0 - 10.5 K/uL  ? RBC 4.35 4.22 - 5.81 MIL/uL  ? Hemoglobin 14.3 13.0 - 17.0 g/dL  ? HCT 41.5 39.0 - 52.0 %  ? MCV 95.4 80.0 - 100.0 fL  ? MCH 32.9 26.0 - 34.0 pg  ? MCHC 34.5 30.0 - 36.0 g/dL  ? RDW 11.4 (L) 11.5 - 15.5 %  ? Platelets 193 150 - 400 K/uL  ? nRBC 0.0 0.0 - 0.2 %  ? Neutrophils Relative % 47 %  ? Neutro Abs 3.2 1.7 - 7.7 K/uL  ? Lymphocytes Relative 38 %  ? Lymphs Abs 2.5 0.7 - 4.0 K/uL  ? Monocytes Relative 12 %  ? Monocytes Absolute 0.8 0.1 - 1.0 K/uL  ? Eosinophils Relative 2 %  ? Eosinophils Absolute 0.1 0.0 - 0.5 K/uL  ? Basophils Relative 1 %  ? Basophils Absolute 0.1 0.0 - 0.1 K/uL  ? Immature Granulocytes 0 %  ? Abs  Immature Granulocytes 0.01 0.00 - 0.07 K/uL  ?  Comment: Performed at [facility], [address], [place], [place] [address]  ?Basic metabolic panel     Status: Abnormal  ? Collection Time: 03/19/22  7:24 PM  ?Result Value Ref Range  ? Sodium 138 135 - 145 mmol/L  ? Potassium 3.8 3.5 - 5.1 mmol/L  ? Chloride 103 98 - 111 mmol/L  ? CO2 26 22 - 32 mmol/L  ? Glucose, Bld 93 70 - 99 mg/dL  ?  Comment: Glucose reference range applies only to samples taken after fasting for at least 8 hours.  ? BUN 9 6 - 20 mg/dL  ? Creatinine, Ser 0.78 0.61 - 1.24 mg/dL  ? Calcium 8.8 (L) 8.9 - 10.3 mg/dL  ? GFR, Estimated >60 >60 mL/min  ?  Comment: (NOTE) ?Calculated using the CKD-EPI Creatinine Equation (2021) ?  ? Anion gap 9 5 - 15  ?  Comment: Performed at [facility], [address] [place], [place] [address]  ? ? ?CT Maxillofacial Wo Contrast ? ?Result Date: 03/19/2022 ?CLINICAL DATA:  Facial trauma, blunt  EXAM: CT MAXILLOFACIAL WITHOUT CONTRAST TECHNIQUE: Multidetector CT imaging of the maxillofacial structures was performed. Multiplanar CT image reconstructions were also generated. RADIATION DOSE REDUCTION: This exam was performed according to the departmental dose-optimization program which includes automated exposure control, adjustment of the mA and/or kV according to patient size and/or use of iterative reconstruction technique. COMPARISON:  None. FINDINGS: Osseous: Acute displaced and mildly comminuted fracture of the left mandibular ramus near the mandibular foramen extending to the angle. Additional mildly displaced fracture of the left condylar head. Third nondisplaced fracture of the right mandibular ramus near the mandibular foramen. Orbits: No intraorbital hematoma. Sinuses: Mild lobular right maxillary sinus mucosal thickening. Soft tissues: Soft tissue swelling at the chin. Limited intracranial: No acute abnormality. IMPRESSION: Acute fractures of the mandible as detailed above. Electronically Signed    By: Guadlupe Spanish M.D.   On: 03/19/2022 18:54   ? ?ROS ?Blood pressure (!) 145/89, pulse 67, temperature 99 ?F (37.2 ?C), temperature source Oral, resp. rate 19, height 5\' 9"  (1.753 m), weight 76.7 kg, SpO2 100 %. ?Physical Exam ?HENT:  ?   Head: Normocephalic and atraumatic.  ?   Right Ear: Tympanic membrane normal.  ?   Left Ear: Tympanic membrane normal.  ?   Nose: Nose normal.  ?   Mouth/Throat:  ?   Comments: His teeth are without significant disrepair.  It looks like his occlusion is shifted to the left.  His left side hits first before the right side.  There is no ecchymosis or significant swelling.  The tongue is normal and floor mouth is normal. ?Eyes:  ?   Extraocular Movements: Extraocular movements intact.  ?   Pupils: Pupils are equal, round, and reactive to light.  ?Musculoskeletal:  ?   Cervical back: Normal range of motion.  ?Neurological:  ?   Mental Status: He is alert.  ? ? ? ? ?Assessment/Plan: ?Mandible fracture-patient both angles and more extended up superiorly on the left are fractured.  The bone is not significantly displaced to justify the need for open reduction.  We talked about options of open reduction, closed reduction with MMF, and soft diet.  Since he has malocclusion, we will proceed with the MMF.  He will need to be in wires for about 4 to 5 weeks.  He last ate at approximately 2-3 o'clock today so he will be admitted and proceed tomorrow with the surgery. ? ? ?03/19/2022, 8:33 PM  ? ? ? ?

## 2022-03-19 NOTE — ED Triage Notes (Signed)
Pt c/o L sided facial swelling after hitting chin Friday morning, states swelling began "a few hours after." States he took ibuprofen & "an antibiotic" at home. States he bit down hard when he hit his chin. Associated dental pain in "back L corner, more lower." No regular dentist ? ?Ibuprofen "just before I got here" ?

## 2022-03-19 NOTE — ED Provider Notes (Signed)
?[facility] EMERGENCY DEPARTMENT ?Provider Note ? ? ?CSN: [id number] ?Arrival date & time: 03/19/22  1634 ? ?  ? ?History ? ?Chief Complaint  ?Patient presents with  ? Facial Pain  ? Dental Pain  ? ? ?Edward Gonzales is a 36 y.o. male who presents to the ED today with complaint of gradual onset, constant, achy, left jaw pain that occurred Friday.  Patient states that he was working on his ladder approximately 3 feet off the ground when he fell. On his way down he hit his chin on a rung of the ladder. He states that he bit down hard at that time.  He states that since then he has had swelling and pain to the left jaw.  He is having difficulty opening his jaw all the way.  He denies any specific dental pain to me despite triage note.  He has not taking anything specifically for pain.  ? ?The history is provided by the patient and medical records.  ? ?  ? ?Home Medications ?Prior to Admission medications   ?Not on File  ?   ? ?Allergies    ?Silicone   ? ?Review of Systems   ?Review of Systems  ?Constitutional:  Negative for chills and fever.  ?HENT:  Negative for dental problem.   ?     + jaw pain  ?All other systems reviewed and are negative. ? ?Physical Exam ?Updated Vital Signs ?BP (!) 145/89 (BP Location: Right Arm)   Pulse 67   Temp 99 ?F (37.2 ?C) (Oral)   Resp 19   Ht 5\' 9"  (1.753 m)   Wt 76.7 kg   SpO2 100%   BMI 24.96 kg/m?  ?Physical Exam ?Vitals and nursing note reviewed.  ?Constitutional:   ?   Appearance: He is not ill-appearing.  ?HENT:  ?   Head: Normocephalic.  ?   Comments: Mild swelling noted to L mandible with associated TTP. 2 finger trismus. Dentition intact without loosening.  ?Eyes:  ?   Conjunctiva/sclera: Conjunctivae normal.  ?Cardiovascular:  ?   Rate and Rhythm: Normal rate and regular rhythm.  ?Pulmonary:  ?   Effort: Pulmonary effort is normal.  ?   Breath sounds: Normal breath sounds.  ?Skin: ?   General: Skin is warm and dry.  ?   Coloration: Skin is not  jaundiced.  ?Neurological:  ?   Mental Status: He is alert.  ? ? ?ED Results / Procedures / Treatments   ?Labs ?(all labs ordered are listed, but only abnormal results are displayed) ?Labs Reviewed  ?CBC WITH DIFFERENTIAL/PLATELET - Abnormal; Notable for the following components:  ?    Result Value  ? RDW 11.4 (*)   ? All other components within normal limits  ?BASIC METABOLIC PANEL - Abnormal; Notable for the following components:  ? Calcium 8.8 (*)   ? All other components within normal limits  ? ? ?EKG ?None ? ?Radiology ?CT Maxillofacial Wo Contrast ? ?Result Date: 03/19/2022 ?CLINICAL DATA:  Facial trauma, blunt EXAM: CT MAXILLOFACIAL WITHOUT CONTRAST TECHNIQUE: Multidetector CT imaging of the maxillofacial structures was performed. Multiplanar CT image reconstructions were also generated. RADIATION DOSE REDUCTION: This exam was performed according to the departmental dose-optimization program which includes automated exposure control, adjustment of the mA and/or kV according to patient size and/or use of iterative reconstruction technique. COMPARISON:  None. FINDINGS: Osseous: Acute displaced and mildly comminuted fracture of the left mandibular ramus near the mandibular foramen extending to the angle. Additional mildly  displaced fracture of the left condylar head. Third nondisplaced fracture of the right mandibular ramus near the mandibular foramen. Orbits: No intraorbital hematoma. Sinuses: Mild lobular right maxillary sinus mucosal thickening. Soft tissues: Soft tissue swelling at the chin. Limited intracranial: No acute abnormality. IMPRESSION: Acute fractures of the mandible as detailed above. Electronically Signed   By: Guadlupe Spanish M.D.   On: 03/19/2022 18:54   ? ?Procedures ?Procedures  ? ? ?Medications Ordered in ED ?Medications  ?morphine (PF) 4 MG/ML injection 4 mg (4 mg Intravenous Given 03/19/22 1942)  ? ? ?ED Course/ Medical Decision Making/ A&P ?  ?                        ?Medical Decision  Making ?36 year old male who presents to the ED today with complaint of left sided jaw pain status post mechanical fall that occurred 4 days ago.  On arrival to the ED vitals are stable.  CT maxillofacial without contrast ordered in triage, as I am evaluating patient CT tech is here to take patient to CT.  On exam he is noted to have tenderness palpation along the left mandible without crepitus.  He does have 2 finger trismus.  Dentition does appear intact.  No obvious wounds, lacerations that require suturing.  We will plan for CT scan and further evaluation afterwards. ? ?Amount and/or Complexity of Data Reviewed ?Labs: ordered. ?Radiology: ordered. ?   Details: CT personally viewed by myself - does appaer to have mandibular fracture. Confirmed by radiologist: Acute displaced and mildly comminuted fracture of the left  ?mandibular ramus near the mandibular foramen extending to the angle.  ?Additional mildly displaced fracture of the left condylar head.  ?Third nondisplaced fracture of the right mandibular ramus near the  ?mandibular foramen. ?Discussion of management or test interpretation with external provider(s): Discussed case with ENT Dr. Jearld Fenton who will come evaluate patient at bedside.  ? ?Risk ?Prescription drug management. ?Decision regarding hospitalization. ? ? ?Dr. Jearld Fenton to take pt to the OR.  ? ? ? ? ? ? ? ?Final Clinical Impression(s) / ED Diagnoses ?Final diagnoses:  ?Closed fracture of left side of mandible, unspecified mandibular site, initial encounter (HCC)  ? ? ?Rx / DC Orders ?ED Discharge Orders   ? ? None  ? ?  ? ? ?  ?Tanda Rockers, PA-C ?03/19/22 2051 ? ?  ?Terald Sleeper, MD ?03/19/22 2124 ? ?

## 2022-03-20 ENCOUNTER — Observation Stay (HOSPITAL_BASED_OUTPATIENT_CLINIC_OR_DEPARTMENT_OTHER): Payer: No Typology Code available for payment source | Admitting: Anesthesiology

## 2022-03-20 ENCOUNTER — Encounter (HOSPITAL_COMMUNITY): Payer: Self-pay | Admitting: Otolaryngology

## 2022-03-20 ENCOUNTER — Telehealth (INDEPENDENT_AMBULATORY_CARE_PROVIDER_SITE_OTHER): Payer: Self-pay | Admitting: Otolaryngology

## 2022-03-20 ENCOUNTER — Other Ambulatory Visit: Payer: Self-pay

## 2022-03-20 ENCOUNTER — Observation Stay (HOSPITAL_COMMUNITY): Payer: No Typology Code available for payment source | Admitting: Anesthesiology

## 2022-03-20 ENCOUNTER — Encounter (HOSPITAL_COMMUNITY)
Admission: EM | Disposition: A | Discharge status: Home / Self Care | Payer: Self-pay | Source: Home / Self Care | Attending: Emergency Medicine

## 2022-03-20 DIAGNOSIS — S02652A Fracture of angle of left mandible, initial encounter for closed fracture: Secondary | ICD-10-CM

## 2022-03-20 DIAGNOSIS — M264 Malocclusion, unspecified: Secondary | ICD-10-CM

## 2022-03-20 DIAGNOSIS — S02651A Fracture of angle of right mandible, initial encounter for closed fracture: Secondary | ICD-10-CM

## 2022-03-20 DIAGNOSIS — W11XXXA Fall on and from ladder, initial encounter: Secondary | ICD-10-CM

## 2022-03-20 HISTORY — PX: ORIF MANDIBULAR FRACTURE: SHX2127

## 2022-03-20 LAB — HIV ANTIBODY (ROUTINE TESTING W REFLEX): HIV Screen 4th Generation wRfx: NONREACTIVE

## 2022-03-20 SURGERY — OPEN REDUCTION INTERNAL FIXATION (ORIF) MANDIBULAR FRACTURE
Anesthesia: General

## 2022-03-20 MED ORDER — ONDANSETRON HCL 4 MG/2ML IJ SOLN
INTRAMUSCULAR | Status: DC | PRN
  Administered 2022-03-20: 4 mg via INTRAVENOUS

## 2022-03-20 MED ORDER — PROPOFOL 10 MG/ML IV BOLUS
INTRAVENOUS | Status: AC
  Filled 2022-03-20: qty 20

## 2022-03-20 MED ORDER — HYDROCODONE-ACETAMINOPHEN 7.5-325 MG/15ML PO SOLN
15.0000 mL | Freq: Four times a day (QID) | ORAL | 0 refills | Status: DC | PRN
  Filled 2022-03-20: qty 120, 2d supply, fill #0

## 2022-03-20 MED ORDER — LIDOCAINE 2% (20 MG/ML) 5 ML SYRINGE
INTRAMUSCULAR | Status: DC | PRN
  Administered 2022-03-20: 80 mg via INTRAVENOUS

## 2022-03-20 MED ORDER — HYDROMORPHONE HCL 1 MG/ML IJ SOLN
0.2500 mg | INTRAMUSCULAR | Status: DC | PRN
  Administered 2022-03-20 (×4): 0.5 mg via INTRAVENOUS

## 2022-03-20 MED ORDER — DOUBLE ANTIBIOTIC 500-10000 UNIT/GM EX OINT
TOPICAL_OINTMENT | CUTANEOUS | Status: AC
  Filled 2022-03-20: qty 28.4

## 2022-03-20 MED ORDER — MIDAZOLAM HCL 2 MG/2ML IJ SOLN
INTRAMUSCULAR | Status: AC
  Filled 2022-03-20: qty 2

## 2022-03-20 MED ORDER — DEXMEDETOMIDINE (PRECEDEX) IN NS 20 MCG/5ML (4 MCG/ML) IV SYRINGE
PREFILLED_SYRINGE | INTRAVENOUS | Status: DC | PRN
  Administered 2022-03-20: 8 ug via INTRAVENOUS
  Administered 2022-03-20: 4 ug via INTRAVENOUS
  Administered 2022-03-20: 8 ug via INTRAVENOUS

## 2022-03-20 MED ORDER — OXYMETAZOLINE HCL 0.05 % NA SOLN
NASAL | Status: DC | PRN
  Administered 2022-03-20: 1

## 2022-03-20 MED ORDER — PROPOFOL 10 MG/ML IV BOLUS
INTRAVENOUS | Status: DC | PRN
  Administered 2022-03-20: 170 mg via INTRAVENOUS

## 2022-03-20 MED ORDER — DEXAMETHASONE SODIUM PHOSPHATE 10 MG/ML IJ SOLN
INTRAMUSCULAR | Status: AC
  Filled 2022-03-20: qty 2

## 2022-03-20 MED ORDER — 0.9 % SODIUM CHLORIDE (POUR BTL) OPTIME
TOPICAL | Status: DC | PRN
  Administered 2022-03-20: 1000 mL

## 2022-03-20 MED ORDER — OXYMETAZOLINE HCL 0.05 % NA SOLN
NASAL | Status: AC
  Filled 2022-03-20: qty 30

## 2022-03-20 MED ORDER — DEXAMETHASONE SODIUM PHOSPHATE 10 MG/ML IJ SOLN
INTRAMUSCULAR | Status: DC | PRN
  Administered 2022-03-20: 10 mg via INTRAVENOUS

## 2022-03-20 MED ORDER — HYDROCODONE-ACETAMINOPHEN 7.5-325 MG/15ML PO SOLN: 15.0000 mL | Freq: Four times a day (QID) | ORAL | 0 refills | Status: AC | PRN

## 2022-03-20 MED ORDER — LIDOCAINE-EPINEPHRINE 1 %-1:100000 IJ SOLN
INTRAMUSCULAR | Status: AC
  Filled 2022-03-20: qty 1

## 2022-03-20 MED ORDER — MIDAZOLAM HCL 2 MG/2ML IJ SOLN
INTRAMUSCULAR | Status: DC | PRN
  Administered 2022-03-20: 2 mg via INTRAVENOUS

## 2022-03-20 MED ORDER — HYDROMORPHONE HCL 1 MG/ML IJ SOLN
INTRAMUSCULAR | Status: AC
  Filled 2022-03-20: qty 1

## 2022-03-20 MED ORDER — ORAL CARE MOUTH RINSE: 15.0000 mL | Freq: Once | OROMUCOSAL | Status: AC

## 2022-03-20 MED ORDER — CHLORHEXIDINE GLUCONATE 0.12 % MT SOLN: 15.0000 mL | Freq: Once | OROMUCOSAL | Status: AC

## 2022-03-20 MED ORDER — ONDANSETRON HCL 4 MG/2ML IJ SOLN
INTRAMUSCULAR | Status: AC
  Filled 2022-03-20: qty 4

## 2022-03-20 MED ORDER — LIDOCAINE 2% (20 MG/ML) 5 ML SYRINGE
INTRAMUSCULAR | Status: AC
  Filled 2022-03-20: qty 10

## 2022-03-20 MED ORDER — CEPHALEXIN 250 MG/5ML PO SUSR
500.0000 mg | Freq: Three times a day (TID) | ORAL | 0 refills | Status: DC
Start: 2022-03-20 — End: 2022-03-20
  Filled 2022-03-20: qty 200, 4d supply, fill #0

## 2022-03-20 MED ORDER — LACTATED RINGERS IV SOLN: INTRAVENOUS | Status: DC

## 2022-03-20 MED ORDER — ROCURONIUM BROMIDE 10 MG/ML (PF) SYRINGE
PREFILLED_SYRINGE | INTRAVENOUS | Status: DC | PRN
  Administered 2022-03-20: 50 mg via INTRAVENOUS

## 2022-03-20 MED ORDER — CHLORHEXIDINE GLUCONATE 0.12 % MT SOLN
OROMUCOSAL | Status: AC
Start: 2022-03-20 — End: 2022-03-20
  Administered 2022-03-20: 15 mL via OROMUCOSAL
  Filled 2022-03-20: qty 15

## 2022-03-20 MED ORDER — FENTANYL CITRATE (PF) 250 MCG/5ML IJ SOLN
INTRAMUSCULAR | Status: AC
  Filled 2022-03-20: qty 5

## 2022-03-20 MED ORDER — ROCURONIUM BROMIDE 10 MG/ML (PF) SYRINGE
PREFILLED_SYRINGE | INTRAVENOUS | Status: AC
  Filled 2022-03-20: qty 10

## 2022-03-20 MED ORDER — CEPHALEXIN 250 MG/5ML PO SUSR: 500.0000 mg | Freq: Three times a day (TID) | ORAL | 0 refills | Status: AC

## 2022-03-20 MED ORDER — SUGAMMADEX SODIUM 200 MG/2ML IV SOLN
INTRAVENOUS | Status: DC | PRN
  Administered 2022-03-20 (×2): 200 mg via INTRAVENOUS

## 2022-03-20 MED ORDER — FENTANYL CITRATE (PF) 250 MCG/5ML IJ SOLN
INTRAMUSCULAR | Status: DC | PRN
  Administered 2022-03-20 (×2): 50 ug via INTRAVENOUS
  Administered 2022-03-20: 100 ug via INTRAVENOUS
  Administered 2022-03-20: 50 ug via INTRAVENOUS

## 2022-03-20 SURGICAL SUPPLY — 45 items
BAG COUNTER SPONGE SURGICOUNT (BAG) ×2 IMPLANT
BAR FIX PREFORMED OMNIMAX (Miscellaneous) ×2 IMPLANT
BLADE MANDIBULAR SCREWDRIVER (BLADE) ×1 IMPLANT
BLADE SURG 15 STRL LF DISP TIS (BLADE) IMPLANT
BLADE SURG 15 STRL SS (BLADE)
CANISTER SUCT 3000ML PPV (MISCELLANEOUS) ×2 IMPLANT
CLEANER TIP ELECTROSURG 2X2 (MISCELLANEOUS) ×2 IMPLANT
COVER BACK TABLE 60X90IN (DRAPES) ×1 IMPLANT
COVER SURGICAL LIGHT HANDLE (MISCELLANEOUS) ×4 IMPLANT
DECANTER SPIKE VIAL GLASS SM (MISCELLANEOUS) ×1 IMPLANT
DRAPE HALF SHEET 40X57 (DRAPES) IMPLANT
ELECT COATED BLADE 2.86 ST (ELECTRODE) IMPLANT
ELECT NDL BLADE 2-5/6 (NEEDLE) IMPLANT
ELECT NEEDLE BLADE 2-5/6 (NEEDLE) IMPLANT
ELECT REM PT RETURN 9FT ADLT (ELECTROSURGICAL) ×2
ELECTRODE REM PT RTRN 9FT ADLT (ELECTROSURGICAL) ×1 IMPLANT
GAUZE 4X4 16PLY ~~LOC~~+RFID DBL (SPONGE) ×1 IMPLANT
GLOVE ECLIPSE 7.5 STRL STRAW (GLOVE) ×2 IMPLANT
GOWN STRL REUS W/ TWL LRG LVL3 (GOWN DISPOSABLE) ×2 IMPLANT
GOWN STRL REUS W/TWL LRG LVL3 (GOWN DISPOSABLE) ×2
KIT BASIN OR (CUSTOM PROCEDURE TRAY) ×2 IMPLANT
KIT TURNOVER KIT B (KITS) ×2 IMPLANT
NDL HYPO 25GX1X1/2 BEV (NEEDLE) IMPLANT
NEEDLE HYPO 25GX1X1/2 BEV (NEEDLE) IMPLANT
NS IRRIG 1000ML POUR BTL (IV SOLUTION) ×2 IMPLANT
PAD ARMBOARD 7.5X6 YLW CONV (MISCELLANEOUS) ×4 IMPLANT
PATTIES SURGICAL .5 X3 (DISPOSABLE) IMPLANT
PENCIL FOOT CONTROL (ELECTRODE) ×2 IMPLANT
POSITIONER HEAD DONUT 9IN (MISCELLANEOUS) IMPLANT
PROTECTOR CORNEAL (OPHTHALMIC RELATED) IMPLANT
SCISSORS WIRE ANG 4 3/4 DISP (INSTRUMENTS) ×1 IMPLANT
SCREW BONE 2X7 CROSS DRIVE (Screw) ×4 IMPLANT
SCREW BONE MANDIB SD 2X9 (Screw) ×6 IMPLANT
SUT CHROMIC 3 0 PS 2 (SUTURE) ×1 IMPLANT
SUT CHROMIC 4 0 PS 2 18 (SUTURE) ×3 IMPLANT
SUT ETHILON 5 0 P 3 18 (SUTURE) ×1
SUT NYLON ETHILON 5-0 P-3 1X18 (SUTURE) ×1 IMPLANT
SUT SILK 2 0 PERMA HAND 18 BK (SUTURE) IMPLANT
SUT STEEL 0 (SUTURE)
SUT STEEL 0 18XMFL TIE 17 (SUTURE) IMPLANT
SUT STEEL 4 (SUTURE) ×1 IMPLANT
TOWEL GREEN STERILE FF (TOWEL DISPOSABLE) ×2 IMPLANT
TRAY ENT MC OR (CUSTOM PROCEDURE TRAY) ×2 IMPLANT
WATER STERILE IRR 1000ML POUR (IV SOLUTION) ×2 IMPLANT
WIRE 24 GAUGE OMINIMAX MMF (WIRE) ×1 IMPLANT

## 2022-03-20 NOTE — Anesthesia Preprocedure Evaluation (Addendum)
Anesthesia Evaluation  ?Patient identified by MRN, date of birth, ID band ?Patient awake ? ?General Assessment Comment:Trauma history noted ?Dr. Chilton Si ? ?Reviewed: ?Allergy & Precautions, NPO status , Patient's Chart, lab work & pertinent test results ? ?Airway ?Mallampati: III ? ? ? ? ?Mouth opening: Limited Mouth Opening ? Dental ?  ?Pulmonary ?Current Smoker and Patient abstained from smoking.,  ?  ?breath sounds clear to auscultation ? ? ? ? ? ? Cardiovascular ?negative cardio ROS ? ? ?Rhythm:Regular Rate:Normal ? ? ?  ?Neuro/Psych ?  ? GI/Hepatic ?negative GI ROS, Neg liver ROS,   ?Endo/Other  ?negative endocrine ROS ? Renal/GU ?  ? ?  ?Musculoskeletal ? ? Abdominal ?  ?Peds ? Hematology ?  ?Anesthesia Other Findings ? ? Reproductive/Obstetrics ? ?  ? ? ? ? ? ? ? ? ? ? ? ? ? ?  ?  ? ? ? ? ? ? ? ?Anesthesia Physical ?Anesthesia Plan ? ?ASA: 1 ? ?Anesthesia Plan: General  ? ?Post-op Pain Management:   ? ?Induction:  ? ?PONV Risk Score and Plan: 2 and Ondansetron, Dexamethasone, Midazolam and Treatment may vary due to age or medical condition ? ?Airway Management Planned: Video Laryngoscope Planned and Nasal ETT ? ?Additional Equipment:  ? ?Intra-op Plan:  ? ?Post-operative Plan: Extubation in OR ? ?Informed Consent: I have reviewed the patients History and Physical, chart, labs and discussed the procedure including the risks, benefits and alternatives for the proposed anesthesia with the patient or authorized representative who has indicated his/her understanding and acceptance.  ? ? ? ?Dental advisory given ? ?Plan Discussed with: CRNA, Anesthesiologist and Surgeon ? ?Anesthesia Plan Comments:   ? ? ? ? ? ? ?Anesthesia Quick Evaluation ? ?

## 2022-03-20 NOTE — Telephone Encounter (Signed)
Meds not available at [facility] so called to [facility] ?

## 2022-03-20 NOTE — Op Note (Signed)
Preop/postop diagnosis: Mandible fracture ?Procedure: Maxillary mandibular fixation ?Anesthesia: General ?Estimated blood loss 0:  ?Indications 36 year old with a injury from a ladder fall with bilateral angle  fractures that are only mildly displaced on the left side.  He has malocclusion.  We discussed options and he wants to proceed with the MMF.  We discussed risk, benefits, and options.  All his questions were answered and consent was obtained. ?Procedure: Patient was taken the operating placed in supine position after general nasal tracheal intubation the patient was draped in usual sterile manner.  The mandible was examined and the occlusion was shifted to the left.  Manipulation of the mandible pulling at anterior allowed excellent occlusion to be obtained.  The occlusion was held and all aspects look to be in good position.  The arch bar was placed in the maxillary and mandibular location using #9 screws and the top #7 screws on the mandible.  Loop 22-gauge wire were used to secure the occlusion.  The oral cavity oropharynx was suctioned out.  The patient was then awakened brought to recovery in stable condition counts correct ?

## 2022-03-20 NOTE — Anesthesia Procedure Notes (Signed)
Procedure Name: Intubation ?Date/Time: 03/20/2022 11:38 AM ?Performed by: Macie Burows, CRNA ?Pre-anesthesia Checklist: Patient identified, Emergency Drugs available, Suction available and Patient being monitored ?Patient Re-evaluated:Patient Re-evaluated prior to induction ?Oxygen Delivery Method: Circle system utilized ?Preoxygenation: Pre-oxygenation with 100% oxygen ?Induction Type: IV induction ?Ventilation: Mask ventilation without difficulty ?Laryngoscope Size: Glidescope and 4 ?Grade View: Grade I ?Nasal Tubes: Left and Nasal Sheilah Pigeon ?Tube size: 7.5 mm ?Number of attempts: 1 ?Placement Confirmation: ETT inserted through vocal cords under direct vision, positive ETCO2 and breath sounds checked- equal and bilateral ?Tube secured with: Tape ?Dental Injury: Teeth and Oropharynx as per pre-operative assessment  ? ? ? ? ?

## 2022-03-20 NOTE — Progress Notes (Signed)
Patient arrived to floor from PACU. His chart was locked so I could not get into it to check orders. Finally when I did I noticed that he had a d/c order, so I confirmed with Dr. Jearld Fenton that he indeed was supposed to be d/cd so I went over his paperwork with him and he and his girlfriend walked out together.  ?

## 2022-03-20 NOTE — Anesthesia Postprocedure Evaluation (Signed)
Anesthesia Post Note ? ?Patient: Edward Gonzales ? ?Procedure(s) Performed: OPEN REDUCTION INTERNAL FIXATION (ORIF) MANDIBULAR MAXILLARY FRACTURE ? ?  ? ?Patient location during evaluation: PACU ?Anesthesia Type: General ?Level of consciousness: awake ?Pain management: pain level controlled ?Vital Signs Assessment: post-procedure vital signs reviewed and stable ?Respiratory status: spontaneous breathing ?Cardiovascular status: stable ?Postop Assessment: no apparent nausea or vomiting ?Anesthetic complications: no ? ? ?No notable events documented. ? ?Last Vitals:  ?Vitals:  ? 03/20/22 1350 03/20/22 1420  ?BP: (!) 142/90 (!) 136/92  ?Pulse: (!) 54 81  ?Resp: (!) 9 19  ?Temp: 36.4 ?C   ?SpO2: 98% 95%  ?  ?Last Pain:  ?Vitals:  ? 03/20/22 1350  ?TempSrc:   ?PainSc: 6   ? ? ?  ?  ?  ?  ?  ?  ? ?Jasimine Simms ? ? ? ? ?

## 2022-03-20 NOTE — Transfer of Care (Signed)
Immediate Anesthesia Transfer of Care Note ? ?Patient: Edward Gonzales ? ?Procedure(s) Performed: OPEN REDUCTION INTERNAL FIXATION (ORIF) MANDIBULAR MAXILLARY FRACTURE ? ?Patient Location: PACU ? ?Anesthesia Type:General ? ?Level of Consciousness: awake, alert  and oriented ? ?Airway & Oxygen Therapy: Patient Spontanous Breathing ? ?Post-op Assessment: Report given to RN and Post -op Vital signs reviewed and stable ? ?Post vital signs: Reviewed and stable ? ?Last Vitals:  ?Vitals Value Taken Time  ?BP 136/92 03/20/22 1220  ?Temp    ?Pulse 77 03/20/22 1223  ?Resp 20 03/20/22 1223  ?SpO2 99 % 03/20/22 1223  ?Vitals shown include unvalidated device data. ? ?Last Pain:  ?Vitals:  ? 03/20/22 1052  ?TempSrc: Oral  ?PainSc: 0-No pain  ?   ? ?  ? ?Complications: No notable events documented. ?

## 2022-03-21 ENCOUNTER — Encounter (HOSPITAL_COMMUNITY): Payer: Self-pay | Admitting: Otolaryngology

## 2022-04-01 ENCOUNTER — Encounter (INDEPENDENT_AMBULATORY_CARE_PROVIDER_SITE_OTHER): Payer: Self-pay | Admitting: Otolaryngology

## 2022-04-01 ENCOUNTER — Ambulatory Visit (INDEPENDENT_AMBULATORY_CARE_PROVIDER_SITE_OTHER): Payer: Self-pay | Admitting: Otolaryngology

## 2022-04-01 VITALS — BP 112/73 | HR 70 | Ht 69.0 in | Wt 154.0 lb

## 2022-04-01 DIAGNOSIS — S02652D Fracture of angle of left mandible, subsequent encounter for fracture with routine healing: Secondary | ICD-10-CM

## 2022-04-01 DIAGNOSIS — S02650D Fracture of angle of mandible, unspecified side, subsequent encounter for fracture with routine healing: Secondary | ICD-10-CM

## 2022-04-01 NOTE — Discharge Summary (Signed)
Patient ID: ?Edward Gonzales ?MRN: [id number] ?DOB/AGE: 1986/06/02 36 y.o. ? ?Admit date: 03/19/2022 ?Discharge date: 04/01/2022 ? ?Admission Diagnoses:mandible fracture ? ?Discharge Diagnoses: same ?Principal Problem: ?  Mandible fracture ([place]) ? ? ?Discharged Condition: good ? ?Hospital Course: underwent MMF of mandbile fracture and discharged without issues ? ?Consults:  none ? ?Significant Diagnostic Studies: none ? ?Treatments: none ? ?Discharge Exam: ?Blood pressure 129/90, pulse 69, temperature 98.6 ?F (37 ?C), temperature source Oral, resp. rate 20, height 5\' 9"  (1.753 m), weight 76.7 kg, SpO2 100 %. ?PHYSICAL EXAM: ?Awake and alert. Wires tight and occlusion good. Lungs clear. Ext no swelling or tenderness.  ? ?Disposition: Discharge disposition: 01-Home or Self Care ? ? ? ? ? ? ?Discharge Instructions   ? ? Call MD for:  difficulty breathing, headache or visual disturbances   Complete by: As directed ?  ? Call MD for:  extreme fatigue   Complete by: As directed ?  ? Call MD for:  hives   Complete by: As directed ?  ? Call MD for:  persistant dizziness or light-headedness   Complete by: As directed ?  ? Call MD for:  persistant nausea and vomiting   Complete by: As directed ?  ? Call MD for:  redness, tenderness, or signs of infection (pain, swelling, redness, odor or green/yellow discharge around incision site)   Complete by: As directed ?  ? Call MD for:  severe uncontrolled pain   Complete by: As directed ?  ? Call MD for:  temperature >100.4   Complete by: As directed ?  ? Diet - low sodium heart healthy   Complete by: As directed ?  ? Discharge instructions   Complete by: As directed ?  ? Keep wire cutters with you at all times.  Call if there is any loosening of the wires or movement of your jaw.  Follow-up in about 10 to 14 days and call [phone] for follow-up appointment.  Call [phone] for any additional questions or issues.  ? Increase activity slowly   Complete by: As directed ?  ? ?  ? ?Allergies  as of 03/20/2022   ? ?   Reactions  ? Silicone Rash  ? ?  ? ?  ?Medication List  ?  ? ?ASK your doctor about these medications   ? ?cephALEXin 250 MG/5ML suspension ?Commonly known as: KEFLEX ?Take 10 mLs (500 mg total) by mouth 3 (three) times daily for 7 days. ?Ask about: Should I take this medication? ?  ?HYDROcodone-acetaminophen 7.5-325 mg/15 ml solution ?Commonly known as: HYCET ?Take 15 mLs by mouth 4 (four) times daily as needed for moderate pain. ?Ask about: Which instructions should I use? ?  ? ?  ? ? ? ?Signed: ?Melissa Montane ?04/01/2022, 10:45 AM ? ? ?jmb  ?

## 2022-04-01 NOTE — Progress Notes (Signed)
Edward Gonzales is an 36 y.o. male.   ?Chief Complaint: mandible fracture ?HPI: hx of fall and hit face on ladder. He is doing well with no pain. He feels like his teeth are aligned and wires tight. ? ?No past medical history on file. ? ?Past Surgical History:  ?Procedure Laterality Date  ? INGUINAL HERNIA REPAIR Left   ? 2018  ? ORIF MANDIBULAR FRACTURE N/A 03/20/2022  ? Procedure: OPEN REDUCTION INTERNAL FIXATION (ORIF) MANDIBULAR MAXILLARY FRACTURE;  Surgeon: Suzanna Obey, MD;  Location: [facility] OR;  Service: ENT;  Laterality: N/A;  ? ? ?Family History  ?Problem Relation Age of Onset  ? Cancer Paternal Aunt   ?     lymphoma  ? ?Social History:  reports that he has been smoking cigarettes. He has never used smokeless tobacco. He reports current alcohol use. He reports current drug use. Drug: Marijuana. ? ?Allergies:  ?Allergies  ?Allergen Reactions  ? Silicone Rash  ? ? ?(Not in a hospital admission) ? ? ?No results found for this or any previous visit (from the past 48 hour(s)). ?No results found. ? ? ?Blood pressure 112/73, pulse 70, height 5\' 9"  (1.753 m), weight 154 lb (69.9 kg), SpO2 98 %. ? ?PHYSICAL EXAM: ?Appearance _ awake alert with no distress.  ?Head- atraumatic and no obvious abnormalities ?Eyes- PERRL, EOMI, no conjunctiva injection or ecchymosis ?Ears-  Right- Pinna without inflammation or swelling. canal without obstruction or injury. TM within normal limits ? Left- Pinna without inflammation or swelling. canal without obstruction or injury. TM within normal limits ?Oc/OP- wires are tight and occlusion looks good. No tenderness in fracture sites.  ?Hp/Larynx- normal voice and no airway issues. No swelling or lesions ?Neck- no mass or lesions. Normal movement.  ?Neuro- CNII-XII intact, no sensory deficits.  ?Lungs- normal effort no distress noted ? ? ? ? ?Assessment/Plan ?Mandible fracture- he is doing well and will plan on May 19th for wire removal. He then will have set up for arch bar removal. Follow if  develop any pain or swelling.  ? ?May 21 ?04/01/2022, 10:33 AM  ?

## 2022-04-26 ENCOUNTER — Ambulatory Visit (INDEPENDENT_AMBULATORY_CARE_PROVIDER_SITE_OTHER): Payer: No Typology Code available for payment source | Admitting: Otolaryngology

## 2022-04-26 DIAGNOSIS — S02652D Fracture of angle of left mandible, subsequent encounter for fracture with routine healing: Secondary | ICD-10-CM

## 2022-04-26 DIAGNOSIS — S02650D Fracture of angle of mandible, unspecified side, subsequent encounter for fracture with routine healing: Secondary | ICD-10-CM

## 2022-04-26 DIAGNOSIS — W11XXXD Fall on and from ladder, subsequent encounter: Secondary | ICD-10-CM

## 2022-04-26 NOTE — Progress Notes (Signed)
Edward Gonzales is an 36 y.o. male.   Chief Complaint: mandible fracture HPI: here for wires and arch removal. He is doing well with no pain. He is 6 weeks out.   No past medical history on file.  Past Surgical History:  Procedure Laterality Date   INGUINAL HERNIA REPAIR Left    2018   ORIF MANDIBULAR FRACTURE N/A 03/20/2022   Procedure: OPEN REDUCTION INTERNAL FIXATION (ORIF) MANDIBULAR MAXILLARY FRACTURE;  Surgeon: Suzanna Obey, MD;  Location: [facility] OR;  Service: ENT;  Laterality: N/A;    Family History  Problem Relation Age of Onset   Cancer Paternal Aunt        lymphoma   Social History:  reports that he has been smoking cigarettes. He has never used smokeless tobacco. He reports current alcohol use. He reports current drug use. Drug: Marijuana.  Allergies:  Allergies  Allergen Reactions   Silicone Rash    (Not in a hospital admission)   No results found for this or any previous visit (from the past 48 hour(s)). No results found.   There were no vitals taken for this visit.  PHYSICAL EXAM: Appearance _ awake alert with no distress.  Head- atraumatic and no obvious abnormalities Eyes- PERRL, EOMI, no conjunctiva injection or ecchymosis Ears-  Right- Pinna without inflammation or swelling. canal without obstruction or injury. TM within normal limits  Left- Pinna without inflammation or swelling. canal without obstruction or injury. TM within normal limits Oc/OP- no pain by palpation. He had wires cut. Arch bars removed. Opening good. Occlusion great.  Hp/Larynx- normal voice and no airway issues. No swelling or lesions Neck- no mass or lesions. Normal movement.  Neuro- CNII-XII intact, no sensory deficits.  Lungs- normal effort no distress noted     Assessment/Plan Mandible fracture- he is 6 weeks out and no pain or problems. The wires were cut and he feels great. Occlusion good. Arch bars removed after discussing the option of OR. He will stay on soft diet for  another week and follow up as needed  Suzanna Obey 04/26/2022, 9:24 AM

## 2023-03-27 IMAGING — CT CT MAXILLOFACIAL W/O CM
3 of 6 series · 16 of 47 positions shown, 19 images · non-contrast
Comparison: None.

CLINICAL DATA: Facial trauma, blunt



[Series 3: maxilllofacial 2.0 hr40 3 · axial · 0.38mm/px · z∈[-231,-79]mm · 11 of 90 slices shown, 14 images]
[im 7/90  brain]
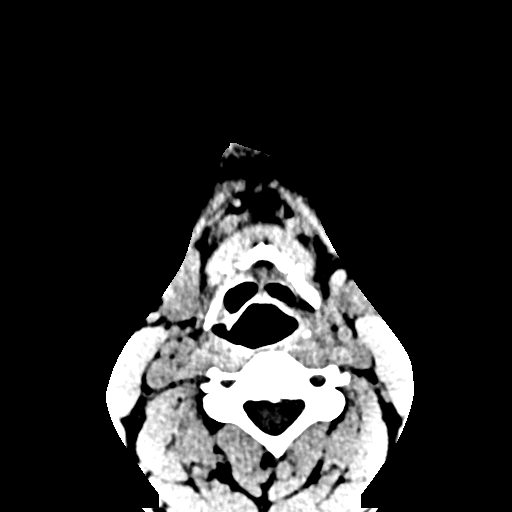
[im 7/90  bone]
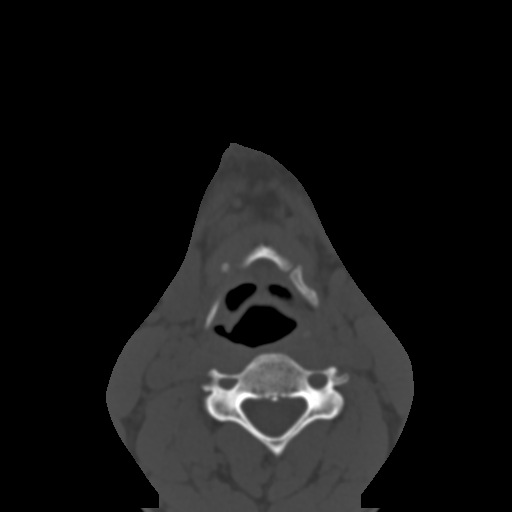
[im 13/90  bone]
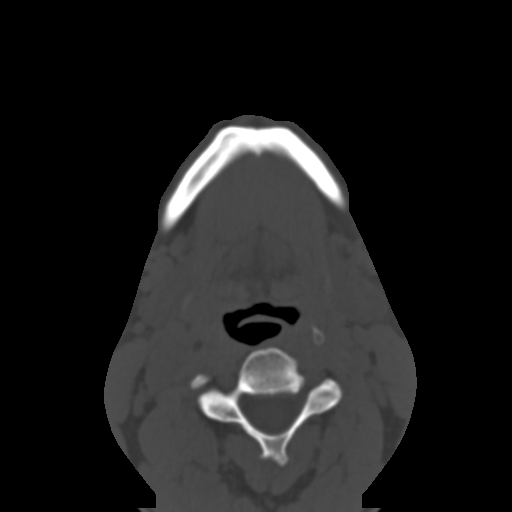
[im 20/90  bone]
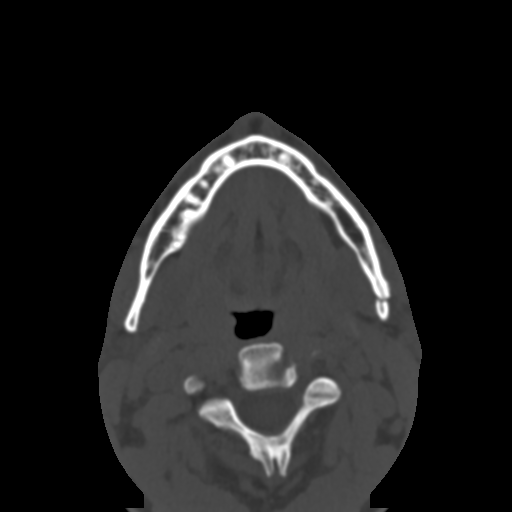
[im 32/90  bone]
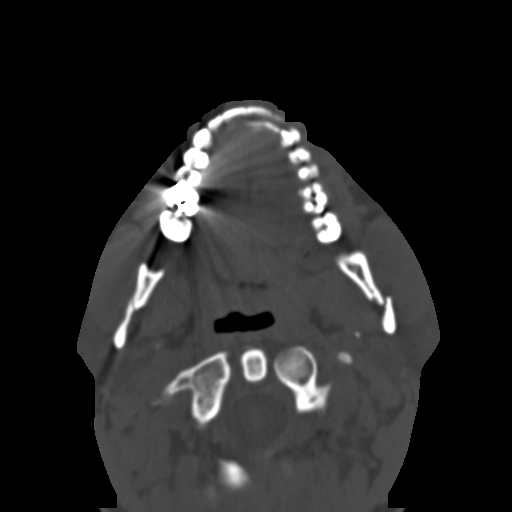
[im 39/90  brain]
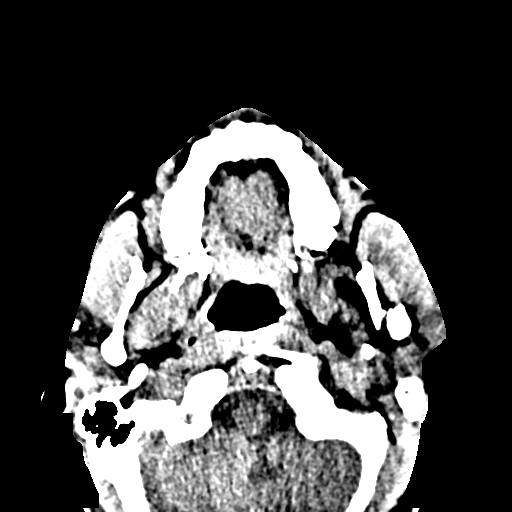
[im 39/90  bone]
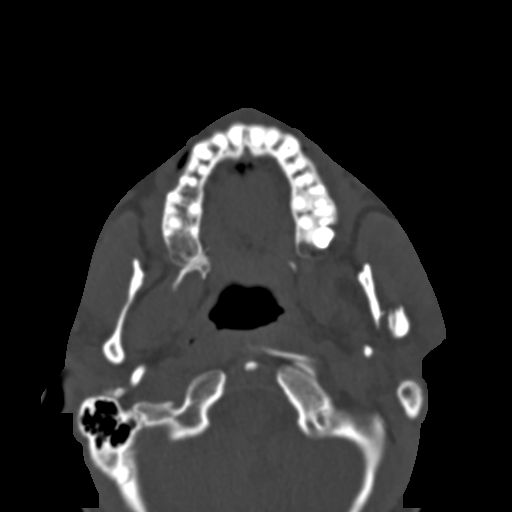
[im 45/90  bone]
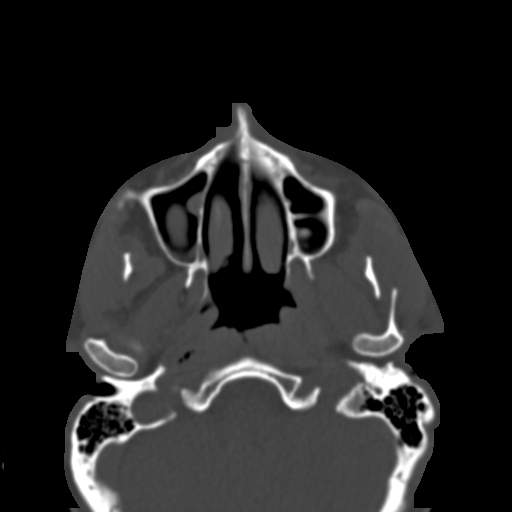
[im 51/90  bone]
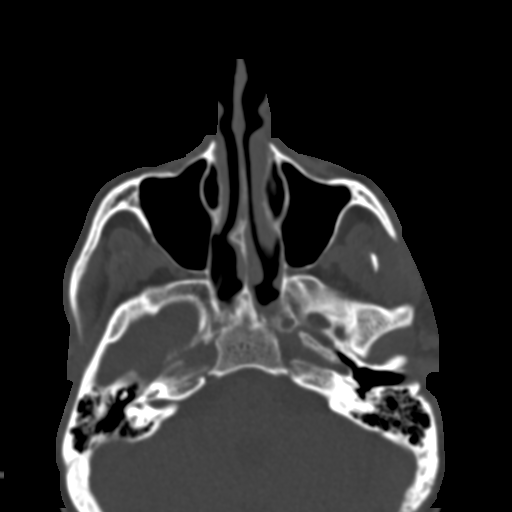
[im 58/90  bone]
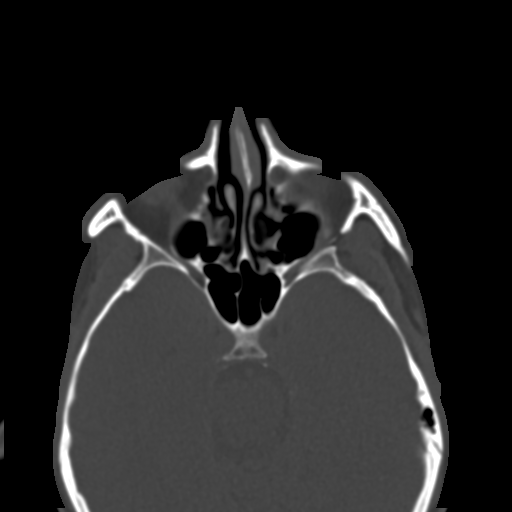
[im 70/90  brain]
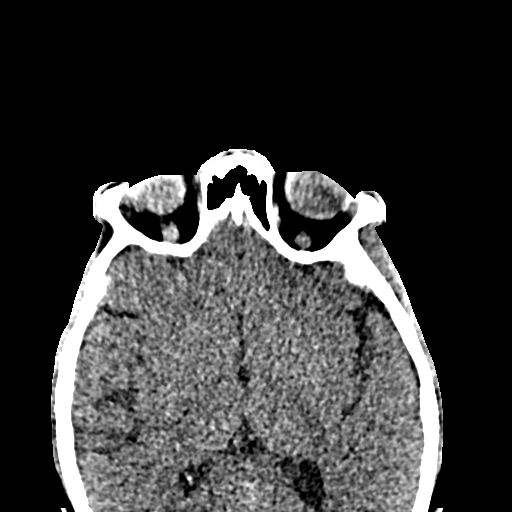
[im 70/90  bone]
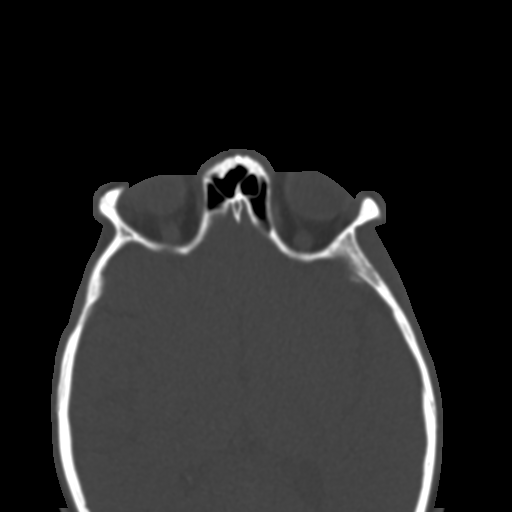
[im 77/90  bone]
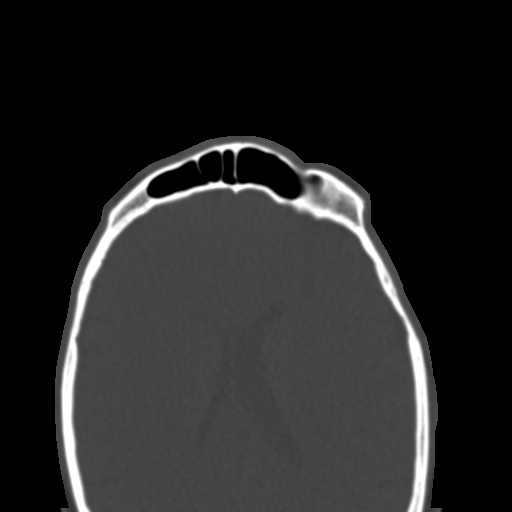
[im 83/90  bone]
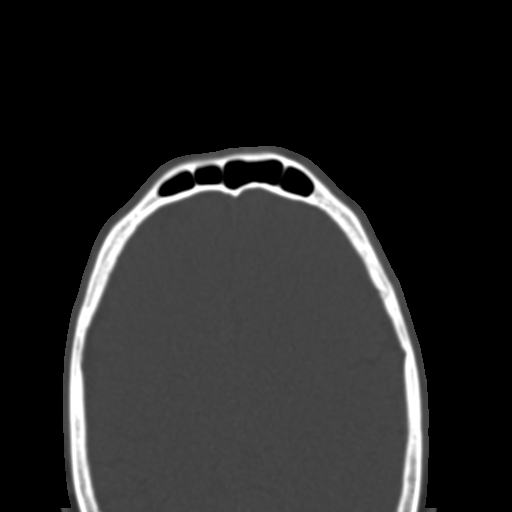

[Series 9: bone cor · coronal · 0.36mm/px · 3 of 77 slices shown]
[im 20/77  bone]
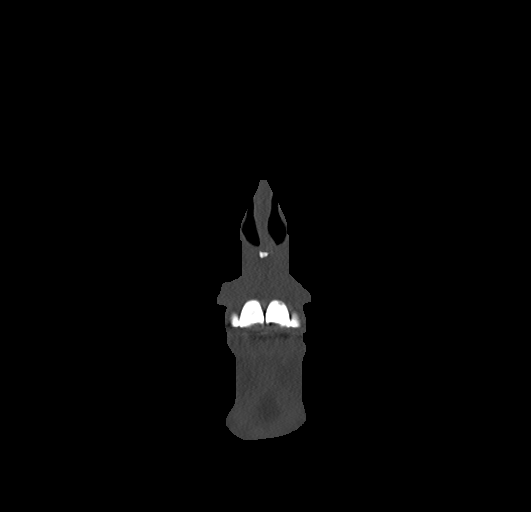
[im 39/77  bone]
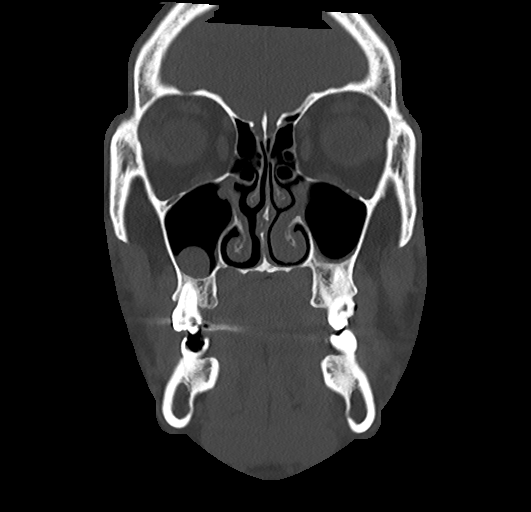
[im 58/77  bone]
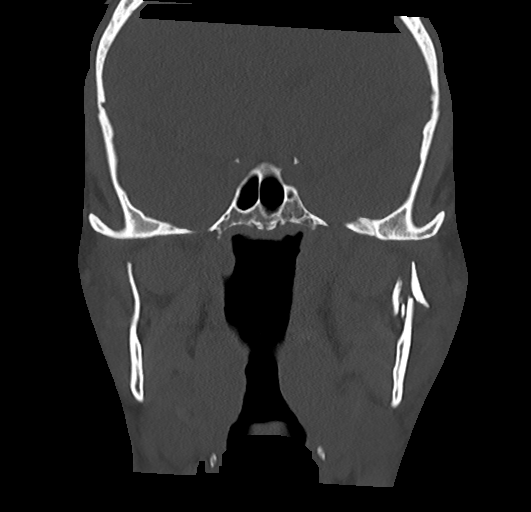

[Series 10: bone sag · sagittal · 0.33mm/px · 2 of 97 slices shown]
[im 33/97  bone]
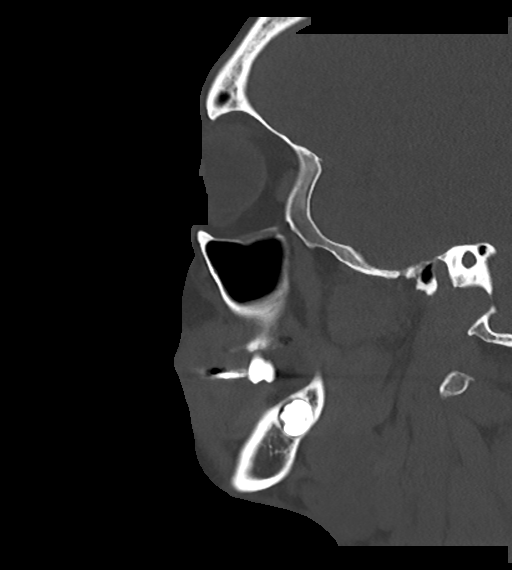
[im 65/97  bone]
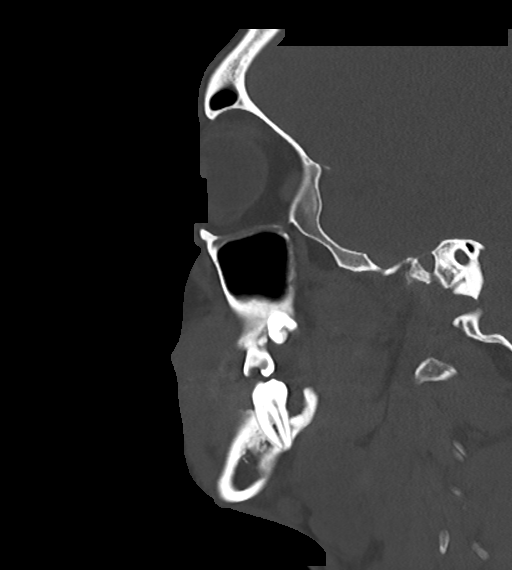

[16 of 47 positions shown; findings below may reference images not displayed]

FINDINGS: Osseous: Acute displaced and mildly comminuted fracture of the left
mandibular ramus near the mandibular foramen extending to the angle.
Additional mildly displaced fracture of the left condylar head.
Third nondisplaced fracture of the right mandibular ramus near the
mandibular foramen.

Orbits: No intraorbital hematoma.

Sinuses: Mild lobular right maxillary sinus mucosal thickening.

Soft tissues: Soft tissue swelling at the chin.

Limited intracranial: No acute abnormality.
IMPRESSION: Acute fractures of the mandible as detailed above.

## 2025-01-11 ENCOUNTER — Emergency Department (HOSPITAL_COMMUNITY)
Admission: EM | Admit: 2025-01-11 | Discharge: 2025-01-11 | Disposition: A | Discharge status: Home / Self Care | Attending: Emergency Medicine | Admitting: Emergency Medicine

## 2025-01-11 ENCOUNTER — Other Ambulatory Visit: Payer: Self-pay

## 2025-01-11 ENCOUNTER — Emergency Department (HOSPITAL_COMMUNITY)

## 2025-01-11 ENCOUNTER — Encounter (HOSPITAL_COMMUNITY): Payer: Self-pay

## 2025-01-11 DIAGNOSIS — S93492A Sprain of other ligament of left ankle, initial encounter: Secondary | ICD-10-CM | POA: Insufficient documentation

## 2025-01-11 DIAGNOSIS — W000XXA Fall on same level due to ice and snow, initial encounter: Secondary | ICD-10-CM | POA: Insufficient documentation

## 2025-01-11 DIAGNOSIS — S93402A Sprain of unspecified ligament of left ankle, initial encounter: Secondary | ICD-10-CM

## 2025-01-11 MED ORDER — IBUPROFEN 800 MG PO TABS
800.0000 mg | ORAL_TABLET | Freq: Once | ORAL | Status: AC
  Administered 2025-01-11: 800 mg via ORAL
  Filled 2025-01-11: qty 1

## 2025-01-11 NOTE — ED Provider Notes (Signed)
 " [place] [facility] AT [facility] Provider Note   CSN: [id number] Arrival date & time: 01/11/25  1307     Patient presents with: Ankle Pain  HPI Edward Gonzales is a 39 y.o. male presenting for left ankle pain.  States he slipped on ice on Friday and twisted his left ankle.  Still able to ambulate and bear weight but it is painful.  He notes swelling and tenderness on the lateral aspect of his ankle.  No open wounds.  Has been doing RICE treatment and taking ibuprofen .  He states that overall symptoms have improved.     Ankle Pain      Prior to Admission medications  Medication Sig Start Date End Date Taking? Authorizing Provider  Cetirizine HCl (ZYRTEC ALLERGY PO) Take by mouth.    [provider]  EQL CHILDRENS IBUPROFEN  PO Take by mouth.    [provider]    Allergies: Silicone    Review of Systems See HPI  Updated Vital Signs BP (!) 148/106   Pulse 85   Temp 98.2 F (36.8 C) (Oral)   Resp 18   Ht 5' 8 (1.727 m)   Wt 74.8 kg   SpO2 98%   BMI 25.09 kg/m   Physical Exam Constitutional:      Appearance: Normal appearance.  HENT:     Head: Normocephalic.     Nose: Nose normal.  Eyes:     Conjunctiva/sclera: Conjunctivae normal.  Pulmonary:     Effort: Pulmonary effort is normal.  Musculoskeletal:     Left ankle: Swelling and ecchymosis present. No deformity. Tenderness present over the lateral malleolus. Normal range of motion. Normal pulse.     Comments: Able to bear weight.  Neurological:     Mental Status: He is alert.  Psychiatric:        Mood and Affect: Mood normal.     (all labs ordered are listed, but only abnormal results are displayed) Labs Reviewed - No data to display  EKG: None  Radiology: DG Ankle Complete Left Result Date: 01/11/2025 EXAM: 3 OR MORE VIEW(S) XRAY OF THE LEFT ANKLE 01/11/2025 02:39:00 PM CLINICAL HISTORY: Fall. COMPARISON: None available. FINDINGS: BONES AND JOINTS: No acute  fracture. No malalignment. SOFT TISSUES: Unremarkable. IMPRESSION: 1. No evidence of acute traumatic injury. Electronically signed by: Dayne Hassell MD 01/11/2025 03:01 PM EST RP Workstation: [id number]     Procedures   Medications Ordered in the ED  ibuprofen  (ADVIL ) tablet 800 mg (800 mg Oral Given 01/11/25 1357)                                    Medical Decision Making Amount and/or Complexity of Data Reviewed Radiology: ordered.  Risk Prescription drug management.   39 yo well appearing male presenting for left ankle pain.  Exam notable for ecchymosis tenderness and swelling about the left lateral malleolus.  Otherwise neurovascularly intact.  Able to ambulate and bear weight.  No acute injury noted on x-ray.  Suspect grade 1 ankle sprain.  Vies to continue RICE treatment and NSAIDs and to follow-up with his PCP.  Did advise if symptoms did not improve after conservative treatment to follow-up with foot and ankle.  Discharge in good condition.     Final diagnoses:  Sprain of left ankle, unspecified ligament, initial encounter    ED Discharge Orders     None  Lang Norleen POUR, PA-C 01/11/25 1522    Towana Ozell BROCKS, MD 01/11/25 [phone]  "

## 2025-01-11 NOTE — Discharge Instructions (Addendum)
 Evaluation today for your left ankle was overall reassuring.  X-ray was negative.  I do suspect a grade 1 ankle sprain.  Please continue RICE treatment with ibuprofen .  You can also take Tylenol  for pain.  Use the ankle brace as needed.

## 2025-01-11 NOTE — ED Triage Notes (Signed)
 Patient slipped on ice Friday. Pain to left ankle. Ambulatory. Still can feel toes and rotate ankle.
